# Patient Record
Sex: Female | Born: 2000 | Race: White | Hispanic: No | Marital: Single | Smoking: Never smoker
Health system: Southern US, Community
[De-identification: ages and names within clinical notes are randomized; demographics above are authoritative.]

## PROBLEM LIST (undated history)

## (undated) DIAGNOSIS — F419 Anxiety disorder, unspecified: Secondary | ICD-10-CM

## (undated) HISTORY — PX: MOUTH SURGERY: SHX715

## (undated) HISTORY — DX: Anxiety disorder, unspecified: F41.9

---
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
## 2019-04-17 ENCOUNTER — Ambulatory Visit: Payer: Self-pay | Admitting: *Deleted

## 2019-04-17 LAB — OB RESULTS CONSOLE HEPATITIS B SURFACE ANTIGEN: Hepatitis B Surface Ag: NEGATIVE

## 2019-04-17 LAB — OB RESULTS CONSOLE GC/CHLAMYDIA
Chlamydia: NEGATIVE
Gonorrhea: NEGATIVE

## 2019-04-17 LAB — OB RESULTS CONSOLE ANTIBODY SCREEN: Antibody Screen: NEGATIVE

## 2019-04-17 LAB — OB RESULTS CONSOLE RUBELLA ANTIBODY, IGM: Rubella: IMMUNE

## 2019-04-17 LAB — OB RESULTS CONSOLE RPR: RPR: NONREACTIVE

## 2019-04-17 LAB — OB RESULTS CONSOLE HIV ANTIBODY (ROUTINE TESTING): HIV: NONREACTIVE

## 2019-04-17 LAB — OB RESULTS CONSOLE ABO/RH: RH Type: POSITIVE

## 2019-04-17 NOTE — Telephone Encounter (Addendum)
Pt says that she is 7 weeks 2 days pregnant; last pm she had  bright red vaginal bleeding (more than usual) at MN and 0200; she says that she had intercourse 1 hour before this started; the pt says that she does 100 squats per night; it has happen previously after intercourse, and after a pelvic exam; the pt says that she spotting bright red or reddish pink; the pt says that she had previously spoken with her OB-GYN about; she has an appt with them on 04/19/2019; pt advised to contact her OB-GYN for further recommendations; she verbalized understanding.  Reason for Disposition . MILD vaginal bleeding (i.e., less than 1 pad / hour; less than patient's usual menstrual bleeding; not just spotting)  Answer Assessment - Initial Assessment Questions 1. ONSET: "When did this bleeding start?"        04/17/2019 at MN and 0200 2. DESCRIPTION: "Describe the bleeding that you are having." "How much bleeding is there?"    - SPOTTING: spotting, or pinkish / brownish mucous discharge; does not fill panti-liner or pad    - MILD:  less than 1 pad / hour; less than patient's usual menstrual bleeding   - MODERATE: 1-2 pads / hour; small-medium blood clots (e.g., pea, grape, small coin)    - SEVERE: soaking 2 or more pads/hour for 2 or more hours; bleeding not contained by pads or continuous red blood from vagina; large blood clots (e.g., golf ball, large coin)     Spotting bright red 3. ABDOMINAL PAIN SEVERITY: If present, ask: "How bad is it?"  (e.g., Scale 1-10; mild, moderate, or severe)   - MILD (1-3): doesn't interfere with normal activities, abdomen soft and not tender to touch    - MODERATE (4-7): interferes with normal activities or awakens from sleep, tender to touch    - SEVERE (8-10): excruciating pain, doubled over, unable to do any normal activities     Cramping 1-1.5 out of 10; has been having them consistently throughout pregrancy 4. PREGNANCY: "Do you know how many weeks or months pregnant you are?"      7 weeks and 2 days 5. EDD: "What date are you expecting to deliver?"  May 8,2021 6. FETAL MOVEMENT: "Has the baby's movement decreased or changed significantly from normal?"     Not felt any movement yet 7. HEMODYNAMIC STATUS: "Are you weak or feeling lightheaded?" If so, ask: "Can you stand and walk normally?"     Light headed if standing up too quickly;normal for her 8. OTHER SYMPTOMS: "What other symptoms are you having with the bleeding?" (e.g., leaking fluid from vagina, contractions)   Not sure  Protocols used: PREGNANCY - VAGINAL BLEEDING LESS THAN [redacted] WEEKS EGA-A-AH, PREGNANCY - VAGINAL BLEEDING GREATER THAN [redacted] WEEKS EGA-A-AH

## 2019-04-17 NOTE — Telephone Encounter (Signed)
Noted. FYI 

## 2019-06-18 ENCOUNTER — Inpatient Hospital Stay (HOSPITAL_COMMUNITY)
Admission: AD | Admit: 2019-06-18 | Discharge: 2019-06-18 | Disposition: A | Discharge status: Home / Self Care | Payer: 59 | Attending: Obstetrics and Gynecology | Admitting: Obstetrics and Gynecology

## 2019-06-18 ENCOUNTER — Other Ambulatory Visit: Payer: Self-pay

## 2019-06-18 ENCOUNTER — Encounter (HOSPITAL_COMMUNITY): Payer: Self-pay

## 2019-06-18 DIAGNOSIS — Z3A16 16 weeks gestation of pregnancy: Secondary | ICD-10-CM | POA: Diagnosis not present

## 2019-06-18 DIAGNOSIS — Z041 Encounter for examination and observation following transport accident: Secondary | ICD-10-CM | POA: Insufficient documentation

## 2019-06-18 DIAGNOSIS — Z880 Allergy status to penicillin: Secondary | ICD-10-CM | POA: Diagnosis not present

## 2019-06-18 DIAGNOSIS — O9A212 Injury, poisoning and certain other consequences of external causes complicating pregnancy, second trimester: Secondary | ICD-10-CM | POA: Diagnosis not present

## 2019-06-18 LAB — URINALYSIS, ROUTINE W REFLEX MICROSCOPIC
Bilirubin Urine: NEGATIVE
Glucose, UA: NEGATIVE mg/dL
Hgb urine dipstick: NEGATIVE
Ketones, ur: NEGATIVE mg/dL
Nitrite: NEGATIVE
Protein, ur: NEGATIVE mg/dL
Specific Gravity, Urine: 1.011 (ref 1.005–1.030)
pH: 6 (ref 5.0–8.0)

## 2019-06-18 LAB — POCT PREGNANCY, URINE: Preg Test, Ur: POSITIVE — AB
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
## 2019-06-18 NOTE — Discharge Instructions (Signed)
Preventing Injuries During Pregnancy  Injuries can happen during pregnancy. Minor falls and accidents usually do not harm you or your baby. But some injuries can harm you and your baby. Tell your doctor about any injury you suffer. What can I do to avoid injuries? Safety  Remove rugs and loose objects on the floor.  Wear comfortable shoes that have a good grip. Do not wear shoes that have high heels.  Always wear your seat belt in the car. The lap belt should be below your belly. Always drive safely.  Do not ride on a motorcycle. Activity  Do not take part in rough and violent activities or sports.  Avoid: ? Walking on wet or slippery floors. ? Lifting heavy pots of boiling or hot liquids. ? Fixing electrical problems. ? Being near fires. General instructions  Take over-the-counter and prescription medicines only as told by your doctor.  Know your blood type and the blood type of the baby's father.  If you are a victim of domestic violence: ? Call your local emergency services (911 in the [place] ? Contact the [employer] Violence Hotline for help and support. Get help right away if:  You fall on your belly or receive any serious blow to your belly.  You have a stiff neck or neck pain after a fall or an injury.  You get a headache or have problems with vision after an injury.  You do not feel the baby move or the baby is not moving as much as normal.  You have been a victim of domestic violence or any other kind of attack.  You have been in a car accident.  You have bleeding from your vagina.  Fluid is leaking from your vagina.  You start to have cramping or pain in your belly (contractions).  You have very bad pain in your lower back.  You feel weak or pass out (faint).  You start to throw up (vomit) after an injury.  You have been burned. Summary  Some injuries that happen during pregnancy can do harm to the baby.  Tell your doctor about any  injury.  Take steps to avoid injury. This includes removing rugs and loose objects on the floor. Always wear your seat belt in the car.  Do not take part in rough and violent activities or sports.  Get help right away if you have any serious accident or injury. This information is not intended to replace advice given to you by your health care provider. Make sure you discuss any questions you have with your health care provider. Document Released: 08/14/2010 Document Revised: 07/21/2016 Document Reviewed: 07/21/2016 Elsevier Patient Education  2020 [place] for [employer] at [facility]       Phone: [phone]  [facility] for [employer] at [place]   Phone: [place] for [employer] at [place]  Phone: [place] for [place] at [facility]  Phone: [place] for [place] at [place]  Phone: [place] for [place] at [facility]   Phone: [facility]       Phone: [phone]  [facility]    Phone: [facility]    Phone: [phone]  [facility]    Phone: [phone]  [place]    Phone: [phone]  [facility] Department-Family Planning  Phone: [phone]   [place] Department-Maternity  Phone: [place]    Phone: [phone]  [facility]   Phone: [phone]  [facility]      Phone: [phone]  [facility]    Phone: [phone]

## 2019-07-27 NOTE — L&D Delivery Note (Signed)
Delivery Note   Patient Name: Kathryn Melendez DOB: 03-29-2001 MRN: [id number]  Date of admission: 12/09/2019 Delivering MD: Noralyn Pick  Date of delivery: 12/09/19 Type of delivery: SVD  Newborn Data: Live born female  Birth Weight:   APGAR: 33, 22  Newborn Delivery   Birth date/time: 12/09/2019 18:59:00 Delivery type: Vaginal, Spontaneous     [name], 19 y.o., @ [redacted]w[redacted]d,  G1P0, who was admitted for IOL for postdates. I was called to the room when she progressed 1+ station in the second stage of labor. Deep variables noted and Dr Charlesetta Garibaldi notified of variables and possible need for vacuome assist, Dr Charlesetta Garibaldi en route to the hospital.   She pushed for 1hour.  She delivered a viable infant, cephalic and restituted to the ROA position over an intact perineum.  A nuchal cord   was identified loose and reduced, with right compound hand. The baby was placed on maternal abdomen while initial step of NRP were perfmored (Dry, Stimulated, and warmed). Hat placed on baby for thermoregulation. Delayed cord clamping was performed for 2 minutes.  Cord double clamped and cut.  Cord cut by father. Apgar scores were 8 and 9. Prophylactic Pitocin was started in the third stage of labor for active management. The placenta delivered spontaneously, shultz, with a 3 vessel cord and was sent to LD.  Inspection revealed right labial. An examination of the vaginal vault and cervix was free from lacerations. The uterus was firm, bleeding stable.  The repair was done under epidural.   Placenta and umbilical artery blood gas were not sent.  There were no complications during the procedure.  Mom and baby skin to skin following delivery. Left in stable condition.  Maternal Info: Anesthesia: Epidural Episiotomy: no Lacerations:  right labial repaired, left labial abrasion not repaired.  Suture Repair: 4.0 vicryl Est. Blood Loss (mL):  61mls  Newborn Info:  Baby Sex: female Circumcision: out pt circ desired Babies Name:  Cira Servant  APGAR (1 MIN): 8   APGAR (5 MINS): 9   APGAR (10 MINS):     Mom to postpartum.  Baby to Couplet care / Skin to Skin.  Dr Charlesetta Garibaldi updated on birth   [place], [place], NP-C 12/09/19 7:46 PM

## 2019-08-15 ENCOUNTER — Other Ambulatory Visit (HOSPITAL_COMMUNITY): Payer: Self-pay | Admitting: Obstetrics and Gynecology

## 2019-08-15 DIAGNOSIS — Z363 Encounter for antenatal screening for malformations: Secondary | ICD-10-CM

## 2019-08-15 DIAGNOSIS — Z3A26 26 weeks gestation of pregnancy: Secondary | ICD-10-CM

## 2019-08-27 ENCOUNTER — Other Ambulatory Visit: Payer: Self-pay

## 2019-08-27 ENCOUNTER — Ambulatory Visit (HOSPITAL_COMMUNITY)
Admission: RE | Admit: 2019-08-27 | Discharge: 2019-08-27 | Disposition: A | Discharge status: Home / Self Care | Payer: 59 | Source: Ambulatory Visit | Attending: Obstetrics and Gynecology | Admitting: Obstetrics and Gynecology

## 2019-08-27 ENCOUNTER — Other Ambulatory Visit (HOSPITAL_COMMUNITY): Payer: Self-pay | Admitting: *Deleted

## 2019-08-27 DIAGNOSIS — Z362 Encounter for other antenatal screening follow-up: Secondary | ICD-10-CM

## 2019-08-27 DIAGNOSIS — Z3A26 26 weeks gestation of pregnancy: Secondary | ICD-10-CM | POA: Insufficient documentation

## 2019-08-27 DIAGNOSIS — Z363 Encounter for antenatal screening for malformations: Secondary | ICD-10-CM | POA: Insufficient documentation

## 2019-09-24 ENCOUNTER — Ambulatory Visit (HOSPITAL_COMMUNITY)
Admission: RE | Admit: 2019-09-24 | Discharge: 2019-09-24 | Disposition: A | Discharge status: Home / Self Care | Payer: 59 | Source: Ambulatory Visit | Attending: Obstetrics and Gynecology | Admitting: Obstetrics and Gynecology

## 2019-09-24 ENCOUNTER — Other Ambulatory Visit (HOSPITAL_COMMUNITY): Payer: Self-pay | Admitting: *Deleted

## 2019-09-24 ENCOUNTER — Other Ambulatory Visit: Payer: Self-pay

## 2019-09-24 ENCOUNTER — Ambulatory Visit (HOSPITAL_COMMUNITY): Payer: 59

## 2019-09-24 DIAGNOSIS — Z3A30 30 weeks gestation of pregnancy: Secondary | ICD-10-CM

## 2019-09-24 DIAGNOSIS — Z362 Encounter for other antenatal screening follow-up: Secondary | ICD-10-CM

## 2019-10-23 ENCOUNTER — Other Ambulatory Visit: Payer: Self-pay

## 2019-10-23 ENCOUNTER — Ambulatory Visit (HOSPITAL_COMMUNITY)
Admission: RE | Admit: 2019-10-23 | Discharge: 2019-10-23 | Disposition: A | Discharge status: Home / Self Care | Payer: 59 | Source: Ambulatory Visit | Attending: Obstetrics and Gynecology | Admitting: Obstetrics and Gynecology

## 2019-10-23 DIAGNOSIS — Z362 Encounter for other antenatal screening follow-up: Secondary | ICD-10-CM | POA: Diagnosis not present

## 2019-10-23 DIAGNOSIS — Z3A34 34 weeks gestation of pregnancy: Secondary | ICD-10-CM

## 2019-11-06 LAB — OB RESULTS CONSOLE GBS: GBS: NEGATIVE

## 2019-11-28 ENCOUNTER — Telehealth (HOSPITAL_COMMUNITY): Payer: Self-pay | Admitting: *Deleted

## 2019-11-28 ENCOUNTER — Encounter (HOSPITAL_COMMUNITY): Payer: Self-pay | Admitting: *Deleted

## 2019-11-28 NOTE — Telephone Encounter (Signed)
Preadmission screen  

## 2019-12-05 ENCOUNTER — Other Ambulatory Visit: Payer: Self-pay | Admitting: Obstetrics & Gynecology

## 2019-12-07 ENCOUNTER — Other Ambulatory Visit (HOSPITAL_COMMUNITY)
Admission: RE | Admit: 2019-12-07 | Discharge: 2019-12-07 | Disposition: A | Discharge status: Home / Self Care | Payer: 59 | Source: Ambulatory Visit | Attending: Obstetrics & Gynecology | Admitting: Obstetrics & Gynecology

## 2019-12-07 LAB — SARS CORONAVIRUS 2 (TAT 6-24 HRS): SARS Coronavirus 2: NEGATIVE

## 2019-12-08 NOTE — H&P (Signed)
Kathryn Melendez is a 19 y.o. female, G1P0000, IUP at 41 weeks, presenting for IOL for Posdates. Late entry prenatal care @ 23 weeks, teenage pregnancy, allergy to penicillin, h/o anxiety no meds, low risk female, out pt circ, EFW via [place] on 5/03 was 6.15lbs. GBS-.  Pt endorse + Fm. Denies vaginal leakage. Denies vaginal bleeding. Endorses occ feeling cxt's.   Patient Active Problem List   Diagnosis Date Noted  . Post term pregnancy, 41 weeks 12/09/2019  . Greater trochanteric bursitis of right hip 06/01/2013  . Poison ivy 06/01/2013  . FOOT PAIN 02/28/2009  . GERD 08/12/2008  . 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    Past Medical History:  Diagnosis Date  . Anxiety      No current facility-administered medications on file prior to encounter.   No current outpatient medications on file prior to encounter.     Allergies  Allergen Reactions  . Penicillins     rash    History of present pregnancy: Pt Info/Preference:  Screening/Consents:  Labs:   EDD: Estimated Date of Delivery: 12/02/19  Establised: Patient's last menstrual period was 02/12/2019.  Anatomy Scan: Date: 10/23/2019 Placenta Location: anterior Genetic Screen: Panoroma:low risk female  AFP: WNL First Tri: Quad: Neg Horizon   Office: ccob            First PNV: 23 weeks Blood Type --/--/O POS, O POS Performed at [facility], [place] [address] [place], [place] [address]  [phone])  Language: english Last PNV: 40.2 wg Rhogam    Flu Vaccine:  utd   Antibody NEG (05/16 0027)  TDaP vaccine utd   GTT: Early: N/A Third Trimester: 72  Feeding Plan: Breast/bottle BTL: no Rubella: Immune (09/22 0000)  Contraception: ??? VBAC: no RPR: Nonreactive (09/22 0000)   Circumcision: Out pt desired   HBsAg: Negative (09/22 0000)  Pediatrician:  ???   HIV: Non-reactive (09/22 0000)   Prenatal Classes: no Additional [place]: Growth see below GBS: Negative/-- (04/13 0000)(For PCN allergy, check sensitivities)        Chlamydia: neg    MFM Referral/Consult:  GC: neg  Support Person: parnter/BF   PAP: ???  Pain Management: epidural Neonatologist Referral:  Hgb Electrophoresis:  AA  Birth Plan: none   Hgb NOB: 13    28W: 12.1  Growth 11/26/2019  OB History    Gravida  1   Para      Term      Preterm      AB      Living        SAB      TAB      Ectopic      Multiple      Live Births             Past Medical History:  Diagnosis Date  . Anxiety    Past Surgical History:  Procedure Laterality Date  . MOUTH SURGERY     Family History: family history includes Cancer in her maternal grandmother and paternal grandmother. Social History:  reports that she has never smoked. She has never used smokeless tobacco. She reports that she does not drink alcohol or use drugs.   Prenatal Transfer Tool  Maternal Diabetes: No Genetic Screening: Normal Maternal Ultrasounds/Referrals: Normal Fetal Ultrasounds or other Referrals:  None Maternal Substance Abuse:  No Significant Maternal Medications:  None Significant Maternal Lab Results: Group B Strep negative  ROS:  Review of Systems  Constitutional: Negative.   HENT: Negative.   Eyes: Negative.   Respiratory: Negative.   Cardiovascular: Negative.   Gastrointestinal: Negative.   Genitourinary: Negative.   Musculoskeletal: Negative.   Skin: Negative.   Neurological: Negative.   Endo/Heme/Allergies: Negative.   Psychiatric/Behavioral: Negative.      Physical Exam: BP 119/78   Pulse 76   Temp 98.2 F (36.8 C) (Oral)   Resp 18   Ht 5\' 6"  (1.676 m)   Wt 83.5 kg   LMP 02/12/2019   BMI 29.70 kg/m   Physical Exam  Constitutional: She is oriented to person, place, and time and well-developed, well-nourished, and in no distress.  HENT:  Head: Normocephalic and atraumatic.  Eyes: Pupils are equal, round, and reactive to light. Conjunctivae are normal.  Cardiovascular: Normal rate and regular rhythm.  Pulmonary/Chest:  Effort normal and breath sounds normal.  Abdominal: Soft. Bowel sounds are normal.  Genitourinary:    Genitourinary Comments: Uterus gravida equal to dates, soft non-tender, pelvis adequate, proven to 8 llbs.    Musculoskeletal:        General: Normal range of motion.     Cervical back: Normal range of motion and neck supple.  Neurological: She is alert and oriented to person, place, and time. Gait normal.  Skin: Skin is warm and dry.  Psychiatric: Affect normal.  Nursing note and vitals reviewed.    NST: FHR baseline 150 bpm, Variability: moderate, Accelerations:present, Decelerations:  Absent= Cat 1/Reactive UC:   Occ SVE: Dilation: Closed Effacement (%): 20 Station: -3 Exam by:: J Daton Szilagyi, CNM, vertex verified by fetal sutures.  Leopold's: Position vertex, EFW 7lbs via leopold's.   Labs: Results for orders placed or performed during the hospital encounter of 12/09/19 (from the past 24 hour(s))  CBC     Status: Abnormal   Collection Time: 12/09/19 12:27 AM  Result Value Ref Range   WBC 12.8 (H) 4.0 - 10.5 K/uL   RBC 4.46 3.87 - 5.11 MIL/uL   Hemoglobin 13.4 12.0 - 15.0 g/dL   HCT 39.9 36.0 - 46.0 %   MCV 89.5 80.0 - 100.0 fL   MCH 30.0 26.0 - 34.0 pg   MCHC 33.6 30.0 - 36.0 g/dL   RDW 13.2 11.5 - 15.5 %   Platelets 194 150 - 400 K/uL   nRBC 0.0 0.0 - 0.2 %  Type and screen     Status: None   Collection Time: 12/09/19 12:27 AM  Result Value Ref Range   ABO/RH(D) O POS    Antibody Screen NEG    Sample Expiration      12/12/2019,2359 Performed at [facility], [place] [address], [place], [place] [address]   ABO/Rh     Status: None (Preliminary result)   Collection Time: 12/09/19 12:27 AM  Result Value Ref Range   ABO/RH(D)      O POS Performed at [place] [address], [place], [place] [address]     Imaging:  No results found.  MAU Course: Orders Placed This Encounter  Procedures  . CBC  . RPR  . Diet clear liquid Room service appropriate?  Yes; Fluid consistency: Thin  . Discontinue Pitocin if tachysystole with non-reassuring FHR is present  . Evaluate fetal heart rate to establish reassuring pattern prior to initiating Cytotec or Pitocin  . If tachysystole WITH reassuring FHR present notify MD / CNM  . Initiate intrauterine resuscitation if tachysystole with non-reasuring FHR is present  . Labor Induction  .  May administer Terbutaline 0.25 mg SQ x 1 dose if tachysystole with non-reassuring FHR is presesnt  . Nofify MD/CNM if tachysystole with non-reassuring FHR is present  . Perform a cervical exam prior to initiating Cytotec or Pitocin  . Activity as tolerated  . Fetal monitoring per unit policy  . Notify Physician  . Vitals signs per unit policy  . Order Rapid HIV per protocol if no results on chart  . Cervical Exam  . Discontinue foley prior to vaginal delivery  . Fundal check post delivery every 15 min x 1 hour then every 30 min x 1 hour  . If Rapid HIV test positive or known HIV positive: initiate AZT orders  . Initiate Carrier Fluid Protocol  . Initiate Oral Care Protocol  . Insert foley catheter  . May in and out cath x 2 for inability to void  . Measure blood pressure post delivery every 15 min x 1 hour then every 30 min x 1 hour  . Patient may have epidural placement upon request  . Full code  . Type and screen  . ABO/Rh  . Insert and maintain IV Line  . Admit to Inpatient (patient's expected length of stay will be greater than 2 midnights or inpatient only procedure)   Meds ordered this encounter  Medications  . misoprostol (CYTOTEC) tablet 25 mcg  . oxytocin (PITOCIN) IV infusion 40 units in NS 1000 mL - Premix    Order Specific Question:   Begin infusion at:    Answer:   1 milli-unit/min (1.5 mL/hr)    Order Specific Question:   Increase infusion by:    Answer:   1 milli-unit/min (1.5 mL/hr)  . terbutaline (BRETHINE) injection 0.25 mg  . lactated ringers infusion  . lactated ringers infusion  500-1,000 mL  . oxytocin (PITOCIN) IV BOLUS FROM BAG  . oxytocin (PITOCIN) IV infusion 40 units in NS 1000 mL - Premix  . acetaminophen (TYLENOL) tablet 650 mg  . fentaNYL (SUBLIMAZE) injection 50-100 mcg  . lidocaine (PF) (XYLOCAINE) 1 % injection 30 mL  . ondansetron (ZOFRAN) injection 4 mg  . sodium citrate-citric acid (ORACIT) solution 30 mL  . sodium phosphate (FLEET) 7-19 GM/118ML enema 1 enema    Assessment/Plan: Corielle Fossum is a 19 y.o. female, G1P0000, IUP at 41 weeks, presenting for IOL for Posdates. Late entry prenatal care @ 23 weeks, teenage pregnancy, allergy to penicillin, h/o anxiety no meds, low risk female, out pt circ, EFW via [place] on 5/03 was 6.15lbs. GBS-. Pt endorse + Fm. Denies vaginal leakage. Denies vaginal bleeding. Endorses occ feeling cxt's.   FWB: Cat 1 Fetal Tracing.   Plan: Admit to [facility] per consult with DR Alwyn Pea Routine [facility] orders Pain med/epidural prn Cytotec for cervical ripening.  Anticipate labor progression   Noralyn Pick NP-C, CNM, MSN 12/09/2019, 4:23 AM

## 2019-12-09 ENCOUNTER — Inpatient Hospital Stay (HOSPITAL_COMMUNITY): Payer: 59 | Admitting: Anesthesiology

## 2019-12-09 ENCOUNTER — Inpatient Hospital Stay (HOSPITAL_COMMUNITY): Payer: 59

## 2019-12-09 ENCOUNTER — Encounter (HOSPITAL_COMMUNITY): Payer: Self-pay | Admitting: Obstetrics & Gynecology

## 2019-12-09 ENCOUNTER — Inpatient Hospital Stay (HOSPITAL_COMMUNITY)
Admission: AD | Admit: 2019-12-09 | Discharge: 2019-12-10 | DRG: 807 | Disposition: A | Discharge status: Home / Self Care | Payer: 59 | Attending: Obstetrics and Gynecology | Admitting: Obstetrics and Gynecology

## 2019-12-09 ENCOUNTER — Other Ambulatory Visit: Payer: Self-pay

## 2019-12-09 DIAGNOSIS — Z880 Allergy status to penicillin: Secondary | ICD-10-CM | POA: Diagnosis not present

## 2019-12-09 DIAGNOSIS — Z20822 Contact with and (suspected) exposure to covid-19: Secondary | ICD-10-CM | POA: Diagnosis present

## 2019-12-09 DIAGNOSIS — Z3A41 41 weeks gestation of pregnancy: Secondary | ICD-10-CM

## 2019-12-09 DIAGNOSIS — O480 Post-term pregnancy: Principal | ICD-10-CM | POA: Diagnosis present

## 2019-12-09 LAB — CBC
HCT: 39.9 % (ref 36.0–46.0)
Hemoglobin: 13.4 g/dL (ref 12.0–15.0)
MCH: 30 pg (ref 26.0–34.0)
MCHC: 33.6 g/dL (ref 30.0–36.0)
MCV: 89.5 fL (ref 80.0–100.0)
Platelets: 194 10*3/uL (ref 150–400)
RBC: 4.46 MIL/uL (ref 3.87–5.11)
RDW: 13.2 % (ref 11.5–15.5)
WBC: 12.8 10*3/uL — ABNORMAL HIGH (ref 4.0–10.5)
nRBC: 0 % (ref 0.0–0.2)

## 2019-12-09 LAB — RPR: RPR Ser Ql: NONREACTIVE

## 2019-12-09 LAB — ABO/RH: ABO/RH(D): O POS

## 2019-12-09 LAB — TYPE AND SCREEN
ABO/RH(D): O POS
Antibody Screen: NEGATIVE

## 2019-12-09 MED ORDER — ONDANSETRON HCL 4 MG PO TABS: 4.0000 mg | ORAL_TABLET | ORAL | Status: DC | PRN

## 2019-12-09 MED ORDER — TERBUTALINE SULFATE 1 MG/ML IJ SOLN: 0.2500 mg | Freq: Once | INTRAMUSCULAR | Status: DC | PRN

## 2019-12-09 MED ORDER — LACTATED RINGERS IV SOLN: INTRAVENOUS | Status: DC

## 2019-12-09 MED ORDER — FENTANYL CITRATE (PF) 100 MCG/2ML IJ SOLN
50.0000 ug | INTRAMUSCULAR | Status: DC | PRN
  Administered 2019-12-09: 50 ug via INTRAVENOUS
  Filled 2019-12-09: qty 2

## 2019-12-09 MED ORDER — BENZOCAINE-MENTHOL 20-0.5 % EX AERO: 1.0000 "application " | INHALATION_SPRAY | CUTANEOUS | Status: DC | PRN

## 2019-12-09 MED ORDER — MISOPROSTOL 25 MCG QUARTER TABLET
25.0000 ug | ORAL_TABLET | ORAL | Status: DC | PRN
  Administered 2019-12-09 (×2): 25 ug via VAGINAL
  Filled 2019-12-09 (×2): qty 1

## 2019-12-09 MED ORDER — PHENYLEPHRINE 40 MCG/ML (10ML) SYRINGE FOR IV PUSH (FOR BLOOD PRESSURE SUPPORT): 80.0000 ug | PREFILLED_SYRINGE | INTRAVENOUS | Status: DC | PRN

## 2019-12-09 MED ORDER — OXYTOCIN 40 UNITS IN NORMAL SALINE INFUSION - SIMPLE MED
1.0000 m[IU]/min | INTRAVENOUS | Status: DC
  Filled 2019-12-09: qty 1000

## 2019-12-09 MED ORDER — TETANUS-DIPHTH-ACELL PERTUSSIS 5-2.5-18.5 LF-MCG/0.5 IM SUSP: 0.5000 mL | Freq: Once | INTRAMUSCULAR | Status: DC

## 2019-12-09 MED ORDER — LIDOCAINE HCL (PF) 1 % IJ SOLN: 30.0000 mL | INTRAMUSCULAR | Status: DC | PRN

## 2019-12-09 MED ORDER — DIPHENHYDRAMINE HCL 50 MG/ML IJ SOLN: 12.5000 mg | INTRAMUSCULAR | Status: DC | PRN

## 2019-12-09 MED ORDER — WITCH HAZEL-GLYCERIN EX PADS: 1.0000 "application " | MEDICATED_PAD | CUTANEOUS | Status: DC | PRN

## 2019-12-09 MED ORDER — ZOLPIDEM TARTRATE 5 MG PO TABS: 5.0000 mg | ORAL_TABLET | Freq: Every evening | ORAL | Status: DC | PRN

## 2019-12-09 MED ORDER — SENNOSIDES-DOCUSATE SODIUM 8.6-50 MG PO TABS
2.0000 | ORAL_TABLET | ORAL | Status: DC
  Administered 2019-12-10: 2 via ORAL
  Filled 2019-12-09: qty 2

## 2019-12-09 MED ORDER — LIDOCAINE HCL (PF) 1 % IJ SOLN
INTRAMUSCULAR | Status: DC | PRN
  Administered 2019-12-09 (×2): 4 mL via EPIDURAL

## 2019-12-09 MED ORDER — LACTATED RINGERS IV SOLN: 500.0000 mL | INTRAVENOUS | Status: DC | PRN

## 2019-12-09 MED ORDER — FENTANYL-BUPIVACAINE-NACL 0.5-0.125-0.9 MG/250ML-% EP SOLN: 12.0000 mL/h | EPIDURAL | Status: DC | PRN

## 2019-12-09 MED ORDER — PRENATAL MULTIVITAMIN CH
1.0000 | ORAL_TABLET | Freq: Every day | ORAL | Status: DC
  Administered 2019-12-10: 1 via ORAL
  Filled 2019-12-09: qty 1

## 2019-12-09 MED ORDER — COCONUT OIL OIL: 1.0000 "application " | TOPICAL_OIL | Status: DC | PRN

## 2019-12-09 MED ORDER — ONDANSETRON HCL 4 MG/2ML IJ SOLN
4.0000 mg | Freq: Four times a day (QID) | INTRAMUSCULAR | Status: DC | PRN
  Administered 2019-12-09: 4 mg via INTRAVENOUS
  Filled 2019-12-09: qty 2

## 2019-12-09 MED ORDER — FENTANYL-BUPIVACAINE-NACL 0.5-0.125-0.9 MG/250ML-% EP SOLN
EPIDURAL | Status: AC
  Filled 2019-12-09: qty 250

## 2019-12-09 MED ORDER — DIBUCAINE (PERIANAL) 1 % EX OINT: 1.0000 "application " | TOPICAL_OINTMENT | CUTANEOUS | Status: DC | PRN

## 2019-12-09 MED ORDER — OXYTOCIN 40 UNITS IN NORMAL SALINE INFUSION - SIMPLE MED: 2.5000 [IU]/h | INTRAVENOUS | Status: DC

## 2019-12-09 MED ORDER — OXYTOCIN BOLUS FROM INFUSION
500.0000 mL | Freq: Once | INTRAVENOUS | Status: AC
  Administered 2019-12-09: 500 mL via INTRAVENOUS

## 2019-12-09 MED ORDER — DIPHENHYDRAMINE HCL 25 MG PO CAPS: 25.0000 mg | ORAL_CAPSULE | Freq: Four times a day (QID) | ORAL | Status: DC | PRN

## 2019-12-09 MED ORDER — LACTATED RINGERS IV SOLN
500.0000 mL | Freq: Once | INTRAVENOUS | Status: AC
  Administered 2019-12-09: 500 mL via INTRAVENOUS

## 2019-12-09 MED ORDER — ACETAMINOPHEN 325 MG PO TABS: 650.0000 mg | ORAL_TABLET | ORAL | Status: DC | PRN

## 2019-12-09 MED ORDER — SODIUM CHLORIDE (PF) 0.9 % IJ SOLN
INTRAMUSCULAR | Status: DC | PRN
  Administered 2019-12-09: 12 mL/h via EPIDURAL

## 2019-12-09 MED ORDER — IBUPROFEN 600 MG PO TABS
600.0000 mg | ORAL_TABLET | Freq: Four times a day (QID) | ORAL | Status: DC
  Administered 2019-12-10 (×4): 600 mg via ORAL
  Filled 2019-12-09 (×3): qty 1

## 2019-12-09 MED ORDER — ONDANSETRON HCL 4 MG/2ML IJ SOLN: 4.0000 mg | INTRAMUSCULAR | Status: DC | PRN

## 2019-12-09 MED ORDER — SOD CITRATE-CITRIC ACID 500-334 MG/5ML PO SOLN: 30.0000 mL | ORAL | Status: DC | PRN

## 2019-12-09 MED ORDER — FLEET ENEMA 7-19 GM/118ML RE ENEM: 1.0000 | ENEMA | RECTAL | Status: DC | PRN

## 2019-12-09 MED ORDER — SIMETHICONE 80 MG PO CHEW: 80.0000 mg | CHEWABLE_TABLET | ORAL | Status: DC | PRN

## 2019-12-09 NOTE — Progress Notes (Addendum)
Labor Progress Note  Kathryn Melendez is a 19 y.o. female, G1P0000, IUP at 41 weeks, presenting for IOL for Posdates. Late entry prenatal care @ 23 weeks, teenage pregnancy, allergy to penicillin, h/o anxiety no meds, low risk female, out pt circ, EFW via [place] on 5/03 was 6.15lbs. GBS-.    Subjective: Called RN for prolonged decled noted to nadir of 60s, prolonged for two mins, then for ofur with slow return to baselines, reviewed R/B/A for FSE and IUPC with pt, pt verbalized consent for procedure. Fetus and pt tolerated both procedures well, and with maternal position change strip now back to cat1 strip. Patient Active Problem List   Diagnosis Date Noted  . Post term pregnancy, 41 weeks 12/09/2019  . Greater trochanteric bursitis of right hip 06/01/2013  . Poison ivy 06/01/2013  . FOOT PAIN 02/28/2009  . GERD 08/12/2008  . 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 No intake/output data recorded. NST: FHR baseline 120 bpm, Variability: moderate, Accelerations:present, Decelerations:  repetative variables noted with cxts. = Cat 2/Reactive CTX:  irregular, every 1-5 minutes, lasting 50-60 seconds Uterus gravid, soft non tender, moderate to palpate with contractions.  SVE:  Dilation: 6.5 Effacement (%): 80 Station: -1 Exam by:: Jade, CNM  FSE and IUPC placed with ease.   MVU 200  Assessment:  Kathryn Melendez is a 19 y.o. female, G1P0000, IUP at 41 weeks, presenting for IOL for Posdates. Late entry prenatal care @ 23 weeks, teenage pregnancy, allergy to penicillin, h/o anxiety no meds, low risk female, out pt circ, EFW via [place] on 5/03 was 6.15lbs. GBS-.  Pt progressing in active labor now off two cytotex and AROM. Patient Active Problem List   Diagnosis Date Noted  . Post term pregnancy, 41 weeks 12/09/2019  . Greater  trochanteric bursitis of right hip 06/01/2013  . Poison ivy 06/01/2013  . FOOT PAIN 02/28/2009  . GERD 08/12/2008  . 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   Membranes:  AROM, clear @ 1416 on 5/16, no s/s of infection  MVU 200  Induction:    Cytotec x2 @ 0336, 0736  Foley Bulb: NO  Pitocin - Not indicated.   Pain management:               IV pain management: x1 dose fentanyl @ 1225             Epidural placement: Placed on 5/16 @ 1240  GBS Negative  Plan: Continue labor plan Continuous monitoring Rest Frequent position changes to facilitate fetal rotation and descent with peanut ball. Will reassess with cervical exam at 1700 or earlier if necessary Continue with natural cxt Anticipate if variables continue to start amnioinfusion.  Anticipate labor progression and vaginal delivery.   Noralyn Pick, NP-C, CNM, MSN 12/09/2019. 4:12 PM

## 2019-12-09 NOTE — Progress Notes (Signed)
Labor Progress Note  Kathryn Melendez is a 19 y.o. female, G1P0000, IUP at 41 weeks, presenting for IOL for Posdates. Late entry prenatal care @ 23 weeks, teenage pregnancy, allergy to penicillin, h/o anxiety no meds, low risk female, out pt circ, EFW via [place] on 5/03 was 6.15lbs. GBS-.    Subjective: Pt resting in bed comfortable post epidural placement, pt desires AROM now, was Cat 1 strip, tolerated AROM well but 5 mins after started having repetative decels, revolved with position changes , fluid bolus. Patient Active Problem List   Diagnosis Date Noted  . Post term pregnancy, 41 weeks 12/09/2019  . Greater trochanteric bursitis of right hip 06/01/2013  . Poison ivy 06/01/2013  . FOOT PAIN 02/28/2009  . GERD 08/12/2008  . 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 No intake/output data recorded. NST: FHR baseline 120 bpm, Variability: moderate, Accelerations:present, Decelerations:  repetative variables noted with cxts. = Cat 2/Reactive CTX:  irregular, every 1-5 minutes, lasting 50-60 seconds Uterus gravid, soft non tender, moderate to palpate with contractions.  SVE:  Dilation: 4 Effacement (%): 80 Station: -1 Exam by:: Luvenia Starch, CNM  Assessment:  Kathryn Melendez is a 19 y.o. female, G1P0000, IUP at 41 weeks, presenting for IOL for Posdates. Late entry prenatal care @ 23 weeks, teenage pregnancy, allergy to penicillin, h/o anxiety no meds, low risk female, out pt circ, EFW via [place] on 5/03 was 6.15lbs. GBS-.  Pt progressing in latent labor off two cytotex and ow AROM. Marland Kitchen  Patient Active Problem List   Diagnosis Date Noted  . Post term pregnancy, 41 weeks 12/09/2019  . Greater trochanteric bursitis of right hip 06/01/2013  . Poison ivy 06/01/2013  . FOOT PAIN 02/28/2009  . GERD 08/12/2008  . 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   Pain management:               IV pain management: x1 dose fentanyl @ 1225             Epidural placement: Placed on 5/16 @ 1240  GBS Negative  Plan: Continue labor plan Continuous monitoring Rest Frequent position changes to facilitate fetal rotation and descent with peanut ball. Will reassess with cervical exam at 1700 or earlier if necessary Continue with natural cxt Anticipate if variables continue to start amnioinfusion.  Anticipate labor progression and vaginal delivery.   Noralyn Pick, NP-C, CNM, MSN 12/09/2019. 4:03 PM

## 2019-12-09 NOTE — Anesthesia Preprocedure Evaluation (Signed)
Anesthesia Evaluation  Patient identified by MRN, date of birth, ID band Patient awake    Reviewed: Allergy & Precautions, Patient's Chart, lab work & pertinent test results  Airway Mallampati: II  TM Distance: >3 FB Neck ROM: Full    Dental no notable dental hx. (+) Teeth Intact   Pulmonary neg pulmonary ROS,    Pulmonary exam normal breath sounds clear to auscultation       Cardiovascular negative cardio ROS Normal cardiovascular exam Rhythm:Regular Rate:Normal     Neuro/Psych Anxiety negative neurological ROS     GI/Hepatic Neg liver ROS, GERD  Medicated and Controlled,  Endo/Other  negative endocrine ROS  Renal/GU negative Renal ROS  negative genitourinary   Musculoskeletal negative musculoskeletal ROS (+)   Abdominal   Peds  Hematology negative hematology ROS (+)   Anesthesia Other Findings   Reproductive/Obstetrics (+) Pregnancy                             Anesthesia Physical Anesthesia Plan  ASA: II  Anesthesia Plan: Epidural   Post-op Pain Management:    Induction:   PONV Risk Score and Plan:   Airway Management Planned: Natural Airway  Additional Equipment:   Intra-op Plan:   Post-operative Plan:   Informed Consent: I have reviewed the patients History and Physical, chart, labs and discussed the procedure including the risks, benefits and alternatives for the proposed anesthesia with the patient or authorized representative who has indicated his/her understanding and acceptance.       Plan Discussed with: Anesthesiologist  Anesthesia Plan Comments:         Anesthesia Quick Evaluation

## 2019-12-09 NOTE — Progress Notes (Signed)
Labor Progress Note  Kathryn Melendez is a 19 y.o. female, G1P0000, IUP at 41 weeks, presenting for IOL for Posdates. Late entry prenatal care @ 23 weeks, teenage pregnancy, allergy to penicillin, h/o anxiety no meds, low risk female, out pt circ, EFW via [place] on 5/03 was 6.15lbs. GBS-.    Subjective: Pt resting in bed with partner on phone next to her, pt endorses crying with some cxt now, pt desires epidural, discussed AROM versus pitocin, pt endorses wanting AROM, and verbally consent, but pt stared crying with nerves and I recommended she get the epidural first then once she is comfortable I would come back in and rupture her.  Patient Active Problem List   Diagnosis Date Noted  . Post term pregnancy, 41 weeks 12/09/2019  . Greater trochanteric bursitis of right hip 06/01/2013  . Poison ivy 06/01/2013  . FOOT PAIN 02/28/2009  . GERD 08/12/2008  . 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 No intake/output data recorded. NST: FHR baseline 125 bpm, Variability: moderate, Accelerations:present, Decelerations:  Absent= Cat 1/Reactive CTX:  irregular, every 1-5 minutes, lasting 50-60 seconds Uterus gravid, soft non tender, mild to palpate with contractions.  SVE:  Dilation: 2.5 Effacement (%): 80 Station: -2 Exam by:: Luvenia Starch, CNM   Assessment:  Kathryn Melendez is a 19 y.o. female, G1P0000, IUP at 41 weeks, presenting for IOL for Posdates. Late entry prenatal care @ 23 weeks, teenage pregnancy, allergy to penicillin, h/o anxiety no meds, low risk female, out pt circ, EFW via [place] on 5/03 was 6.15lbs. GBS-.  Pt progressing in early labor off two cytotex.  Patient Active Problem List   Diagnosis Date Noted  . Post term pregnancy, 41 weeks 12/09/2019  . Greater trochanteric bursitis of right hip 06/01/2013  . Poison ivy 06/01/2013  .  FOOT PAIN 02/28/2009  . GERD 08/12/2008  . 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   Pain management:               IV pain management: x1 dose fentanyl @ 1225             Epidural placement: PRN  GBS Negative  Plan: Continue labor plan Continuous monitoring Rest Ambulate Frequent position changes to facilitate fetal rotation and descent. Will reassess with cervical exam at 1400 or earlier if necessary Plan for epidural now then AROM Anticipate labor progression and vaginal delivery.   Noralyn Pick, NP-C, CNM, MSN 12/09/2019. 3:57 PM

## 2019-12-09 NOTE — Plan of Care (Signed)
  Problem: Education: Goal: Ability to state and carry out methods to decrease the pain will improve Outcome: Completed/Met Goal: Individualized Educational Video(s) Outcome: Completed/Met   Problem: Coping: Goal: Ability to verbalize concerns and feelings about labor and delivery will improve Outcome: Completed/Met   Problem: Life Cycle: Goal: Ability to make normal progression through stages of labor will improve Outcome: Completed/Met Goal: Ability to effectively push during vaginal delivery will improve Outcome: Completed/Met   Problem: Role Relationship: Goal: Will demonstrate positive interactions with the child Outcome: Completed/Met   Problem: Safety: Goal: Risk of complications during labor and delivery will decrease Outcome: Completed/Met   Problem: Pain Management: Goal: Relief or control of pain from uterine contractions will improve Outcome: Completed/Met

## 2019-12-09 NOTE — Progress Notes (Signed)
Labor Progress Note  Kathryn Melendez is a 19 y.o. female, G1P0000, IUP at 41 weeks, presenting for IOL for Posdates. Late entry prenatal care @ 23 weeks, teenage pregnancy, allergy to penicillin, h/o anxiety no meds, low risk female, out pt circ, EFW via [place] on 5/03 was 6.15lbs. GBS-.    Subjective: Pt resting in bed with partner at bedside, pt stable feeling cxt but tolerates WELL.  Patient Active Problem List   Diagnosis Date Noted  . Post term pregnancy, 41 weeks 12/09/2019  . Greater trochanteric bursitis of right hip 06/01/2013  . Poison ivy 06/01/2013  . FOOT PAIN 02/28/2009  . GERD 08/12/2008  . 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 No intake/output data recorded. NST: FHR baseline 125 bpm, Variability: moderate, Accelerations:present, Decelerations:  Absent= Cat 1/Reactive CTX:  irregular, every 1-5 minutes, lasting 50-60 seconds Uterus gravid, soft non tender, mild to palpate with contractions.  SVE:  Dilation: 1 Effacement (%): 20 Station: -3 Exam by:: Acey Lav   Assessment:  Kathryn Melendez is a 19 y.o. female, G1P0000, IUP at 41 weeks, presenting for IOL for Posdates. Late entry prenatal care @ 23 weeks, teenage pregnancy, allergy to penicillin, h/o anxiety no meds, low risk female, out pt circ, EFW via [place] on 5/03 was 6.15lbs. GBS-.  Pt progressing in early labor with Cytotec.  Patient Active Problem List   Diagnosis Date Noted  . Post term pregnancy, 41 weeks 12/09/2019  . Greater trochanteric bursitis of right hip 06/01/2013  . Poison ivy 06/01/2013  . FOOT PAIN 02/28/2009  . GERD 08/12/2008  . 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   Pain management:              IV pain management: PRN             Epidural placement: PRN  GBS Negative  Plan: Continue labor plan Continuous monitoring Rest Ambulate Frequent position changes to facilitate fetal rotation and descent. Will reassess with cervical exam at 1200 or earlier if necessary Will reassess for foley or pitocin next check  Anticipate labor progression and vaginal delivery.   Md Dillard aware of plan and verbalized agreement.   Noralyn Pick, NP-C, CNM, MSN 12/09/2019. 3:51 PM

## 2019-12-09 NOTE — Anesthesia Procedure Notes (Signed)
Epidural Patient location during procedure: OB Start time: 12/09/2019 12:54 PM End time: 12/09/2019 1:02 PM  Staffing Anesthesiologist: Josephine Igo, MD Performed: anesthesiologist   Preanesthetic Checklist Completed: patient identified, IV checked, site marked, risks and benefits discussed, surgical consent, monitors and equipment checked, pre-op evaluation and timeout performed  Epidural Patient position: sitting Prep: DuraPrep and site prepped and draped Patient monitoring: continuous pulse ox and blood pressure Approach: midline Location: L3-L4 Injection technique: LOR air  Needle:  Needle type: Tuohy  Needle gauge: 17 G Needle length: 9 cm and 9 Needle insertion depth: 4 cm Catheter type: closed end flexible Catheter size: 19 Gauge Catheter at skin depth: 9 cm Test dose: negative and Other  Assessment Events: blood not aspirated, injection not painful, no injection resistance, no paresthesia and negative IV test  Additional Notes Patient identified. Risks and benefits discussed including failed block, incomplete  Pain control, post dural puncture headache, nerve damage, paralysis, blood pressure Changes, nausea, vomiting, reactions to medications-both toxic and allergic and post Partum back pain. All questions were answered. Patient expressed understanding and wished to proceed. Sterile technique was used throughout procedure. Epidural site was Dressed with sterile barrier dressing. No paresthesias, signs of intravascular injection Or signs of intrathecal spread were encountered.  Patient was more comfortable after the epidural was dosed. Please see RN's note for documentation of vital signs and FHR which are stable. Reason for block:procedure for pain

## 2019-12-10 LAB — CBC
HCT: 37 % (ref 36.0–46.0)
Hemoglobin: 12.2 g/dL (ref 12.0–15.0)
MCH: 30.4 pg (ref 26.0–34.0)
MCHC: 33 g/dL (ref 30.0–36.0)
MCV: 92.3 fL (ref 80.0–100.0)
Platelets: 162 10*3/uL (ref 150–400)
RBC: 4.01 MIL/uL (ref 3.87–5.11)
RDW: 13.4 % (ref 11.5–15.5)
WBC: 14.3 10*3/uL — ABNORMAL HIGH (ref 4.0–10.5)
nRBC: 0 % (ref 0.0–0.2)

## 2019-12-10 MED ORDER — ACETAMINOPHEN 325 MG PO TABS: 650.0000 mg | ORAL_TABLET | ORAL | Status: DC | PRN

## 2019-12-10 MED ORDER — IBUPROFEN 600 MG PO TABS: 600.0000 mg | ORAL_TABLET | Freq: Four times a day (QID) | ORAL | 0 refills | Status: DC

## 2019-12-10 NOTE — Anesthesia Postprocedure Evaluation (Signed)
Anesthesia Post Note  Patient: Kathryn Melendez  Procedure(s) Performed: AN [facility]     Patient location during evaluation: Mother Baby Anesthesia Type: Epidural Level of consciousness: awake Pain management: satisfactory to patient Vital Signs Assessment: post-procedure vital signs reviewed and stable Respiratory status: spontaneous breathing Cardiovascular status: stable Anesthetic complications: no    Last Vitals:  Vitals:   12/10/19 0232 12/10/19 0644  BP: 114/61 (!) 109/47  Pulse: 62 (!) 54  Resp: 18 18  Temp: 36.5 C 36.6 C  SpO2:  100%    Last Pain:  Vitals:   12/10/19 0644  TempSrc: Oral  PainSc:    Pain Goal:                   Casimer Lanius

## 2019-12-10 NOTE — Lactation Note (Signed)
This note was copied from a baby's chart. Lactation Consultation Note  Patient Name: Kathryn Melendez [id number] Date: 12/10/2019 Reason for consult: Initial assessment;Term;Primapara;1st time breastfeeding  P1 mother whose infant is now 87 hours old.  This is a term baby at 41+0 weeks.    RN requested a lactation consultation.  However, a lactation consult was not initiated on admission.  Asked RN to obtain an order.  RN placed the order and I informed her that I would see this family.  Mother had asked the RN for formula and RN asked me to visit with the family prior to initiating formula.  Baby was STS when I arrived and sleeping on mother's chest.  Mother was concerned that he has not awakened and will not latch at this time.  He has latched a few times since delivery and mother has also hand expressed and spoon fed a few mls of colostrum.  Educated mother on basics including STS, hand expression, tummy size, feeding cues and what to expect the first 24 hours after birth.  Mother did not realize that this is typical behavior for a baby at this age.  Reassured her that baby is doing well at this time and to continue to observe for feeding cues and ask for latch assistance as needed.  Baby has already had one void and three bowel movements; shared the importance of this with parents.  Encouraged to feed 8-12 times/24 hours or sooner if baby shows feeding cues.  Mother has been taught hand expression.  Colostrum container provided and milk storage times reviewed.  Finger feeding demonstrated.  Mother does not have a DEBP for home use.  She is a [facility] participant in [facility] but desires to obtain the formula package.  Informed her that she will not be eligible to obtain a DEBP if this is her choice.  Mother verbalized understanding.  Explained the gift shop pump rental options.  Mother satisfied that baby does not need any formula at this time.  She will call for assistance when baby is ready to  breast feed.  I also explained the option of donor milk if supplementation is needed.  RN updated.   Maternal Data Formula Feeding for Exclusion: No Has patient been taught Hand Expression?: Yes Does the patient have breastfeeding experience prior to this delivery?: No  Feeding Feeding Type: Breast Milk  LATCH Score                   Interventions    Lactation Tools Discussed/Used WIC Program: Yes   Consult Status Consult Status: Follow-up Date: 12/11/19 Follow-up type: In-patient    Valary Manahan R Dyon Rotert 12/10/2019, 1:18 PM

## 2019-12-10 NOTE — Progress Notes (Signed)
CSW received consult for hx of Anxiety. CSW met with MOB to offer support and complete assessment.    CSW congratulated MOB on the birth of infant. CSW advised MOB of CSW's role and the reason for CSW coming by to visit with her. MOB reported that she was not aware of her anxiety diagnosis until she changed OB providers. MOB reported that she was made aware of the diagnoses and about 20 weeks. MOB reported that she has been on medications in the past for her mood disorder and reported that these medications worked well for her. MOB currently repost no desire or need for medications as MOB reported "Ive been well". MOB reported that she has had therapy in the past but stopped it as she felt that she was doing better. MOB declined resources for therapy in the PP period. MOB reported that she isnt SI, HI and she isn't in a DV relationship at this time.   MOB verbalized that she has supports from FOB, her family as well as FOB's family. MOB reported that she has all needed items to care for infant including infant sleeping in a basinet once arrived home.   CSW provided education regarding the baby blues period vs. perinatal mood disorders, discussed treatment and gave resources for mental health follow up if concerns arise.  CSW recommends self-evaluation during the postpartum time period using the New Mom Checklist from Postpartum Progress and encouraged MOB to contact a medical professional if symptoms are noted at any time.   CSW provided review of Sudden Infant Death Syndrome (SIDS) precautions.   CSW identifies no further need for intervention and no barriers to discharge at this time.    Kathryn Melendez Kathryn Melendez, MSW, LCSW [facility] [place] [phone]

## 2019-12-10 NOTE — Progress Notes (Addendum)
Call by RN for pt requesting discharge after 24 hours. Peds has dc'd newborn. Pt aware of time but desires to go  Home if possible. Pt seen this AM and stable. Will re-eval at bedside, complete dc teaching, and follow-up in 6 wks.   Burman Foster, MSN, CNM 12/10/2019 10:00 PM

## 2019-12-10 NOTE — Progress Notes (Signed)
PPD# 1 SVD w/ bilat labial lacerations Information for the patient's newborn:  Rebeccah, Meixsell [id number]  female    Baby Name Lona Millard Circumcision plans out-patient w/ [facility]   S:   Reports feeling good Tolerating PO fluid and solids No nausea or vomiting Bleeding is moderate, no clots Pain controlled with PO meds Up ad lib / ambulatory / voiding w/o difficulty Feeding: Bottle and Breast    O:   VS: BP (!) 109/47 (BP Location: Left Arm)   Pulse (!) 54   Temp 97.9 F (36.6 C) (Oral)   Resp 18   Ht 5\' 6"  (1.676 m)   Wt 83.5 kg   LMP 02/12/2019   SpO2 100%   Breastfeeding Unknown   BMI 29.70 kg/m   LABS:  Recent Labs    12/09/19 0027 12/10/19 0452  WBC 12.8* 14.3*  HGB 13.4 12.2  PLT 194 162   Blood type: --/--/O POS, O POS Performed at [facility] [address] [place], [place] [address]  [phone]) Rubella: Immune (09/22 0000)                      I&O: Intake/Output      05/16 0701 - 05/17 0700 05/17 0701 - 05/18 0700   Urine (mL/kg/hr) 1000 (0.5)    Blood 58    Total Output 1058    Net -1058         Urine Occurrence 1 x      Physical Exam: Alert and oriented X3 Abdomen: soft, non-tender, non-distended  Fundus: firm, non-tender Perineum: non-edematous Lochia: appropriate Extremities: 1+, non-pitting,edema, no calf pain, cords, or tenderness    A:  PPD # 1 Normal exam Anxiety- takes no meds  P:  Routine post partum orders SW consult completed     -declines med and therapy, reports adequate support from FOB and family Anticipate D/C on 12/11/19   Plan reviewed w/ Dr. Effie Shy, MSN, CNM 12/10/2019, 9:51 AM

## 2019-12-10 NOTE — Discharge Summary (Signed)
Postpartum Discharge Summary     Patient Name: Kathryn Melendez DOB: Sep 15, 2000 MRN: [id number]  Date of admission: 12/09/2019 Delivery date:12/09/2019  Delivering provider: Noralyn Pick  Date of discharge: 12/10/2019  Admitting diagnosis: Post term pregnancy, 41 weeks [O48.0, Z3A.41] Intrauterine pregnancy: [redacted]w[redacted]d    Secondary diagnosis:  Active Problems:   Post term pregnancy, 41 weeks  Additional problems:  Patient Active Problem List   Diagnosis Date Noted  . SVD (5/16) 12/10/2019  . Postpartum care following vaginal delivery 12/10/2019  . Post term pregnancy, 41 weeks 12/09/2019  . Greater trochanteric bursitis of right hip 06/01/2013  . GERD 08/12/2008       Discharge diagnosis: Term Pregnancy Delivered                                              Post partum procedures:none Augmentation: Cytotec Complications: None  Hospital course: Induction of Labor With Vaginal Delivery   19y.o. yo G1P1001 at 439w0das admitted to the hospital 12/09/2019 for induction of labor.  Indication for induction: Postdates.  Patient had an uncomplicated labor course as follows: Membrane Rupture Time/Date: 2:16 PM ,12/09/2019   Delivery Method:Vaginal, Spontaneous  Episiotomy: None  Lacerations:  Labial  Details of delivery can be found in separate delivery note.  Patient had a routine postpartum course. Patient is discharged home 12/10/19.  Newborn Data: Birth date:12/09/2019  Birth time:6:59 PM  Gender:Female  Living status:Living  Apgars:8 ,9  Weight:3359 g   Magnesium Sulfate received: No BMZ received: No Rhophylac:N/A MMR:N/A Transfusion:No  Physical exam  Vitals:   12/09/19 2226 12/10/19 0232 12/10/19 0644 12/10/19 1404  BP: (!) 122/57 114/61 (!) 109/47 115/81  Pulse: (!) 56 62 (!) 54 61  Resp: '18 18 18 18  '$ Temp: (!) 97.5 F (36.4 C) 97.7 F (36.5 C) 97.9 F (36.6 C) 98.4 F (36.9 C)  TempSrc: Oral Oral Oral Oral  SpO2: 99%  100%   Weight:      Height:        General: alert, cooperative and no distress Lochia: appropriate Uterine Fundus: firm Incision: N/A DVT Evaluation: No evidence of DVT seen on physical exam. No cords or calf tenderness. No significant calf/ankle edema. Labs: Lab Results  Component Value Date   WBC 14.3 (H) 12/10/2019   HGB 12.2 12/10/2019   HCT 37.0 12/10/2019   MCV 92.3 12/10/2019   PLT 162 12/10/2019   No flowsheet data found. Edinburgh Score: No flowsheet data found.    After visit meds:  Allergies as of 12/10/2019      Reactions   Penicillins    rash      Medication List    TAKE these medications   acetaminophen 325 MG tablet Commonly known as: Tylenol Take 2 tablets (650 mg total) by mouth every 4 (four) hours as needed (for pain scale < 4).   ibuprofen 600 MG tablet Commonly known as: ADVIL Take 1 tablet (600 mg total) by mouth every 6 (six) hours. Start taking on: Dec 11, 2019        Discharge home in stable condition Infant Feeding: Bottle and Breast Infant Disposition:home with mother Discharge instruction: per After Visit Summary and Postpartum booklet. Activity: Advance as tolerated. Pelvic rest for 6 weeks.  Diet: routine diet Anticipated Birth Control: Condoms Postpartum Appointment:6 weeks Additional Postpartum F/U: none Future Appointments:No future appointments.  Follow up Visit: [facility]. Schedule an appointment as soon as possible for a visit in 6 week(s).   Specialty: Obstetrics and Gynecology Contact information: [address] [place] [address] [phone]              12/10/2019 Arrie Eastern, CNM

## 2020-05-13 IMAGING — US US MFM OB COMP +14 WKS
1 series · 13 of 28 positions shown · non-contrast
Comparison: none

[Series 1: us mfm ob comp +14 wks · 13 of 65 slices shown]
[im 3/65]
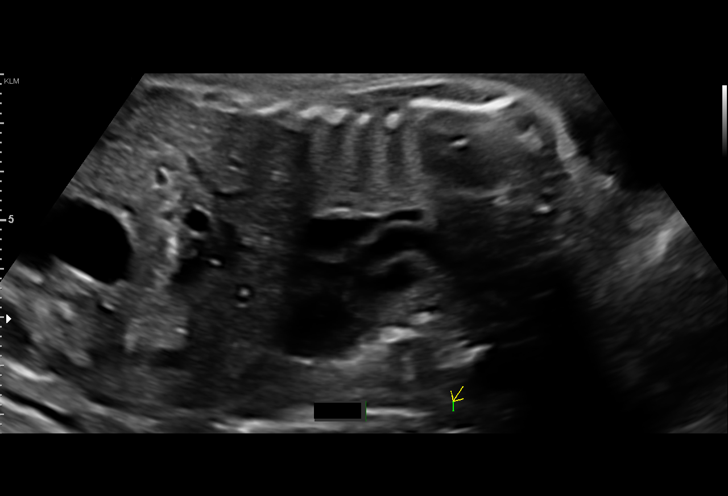
[im 8/65]
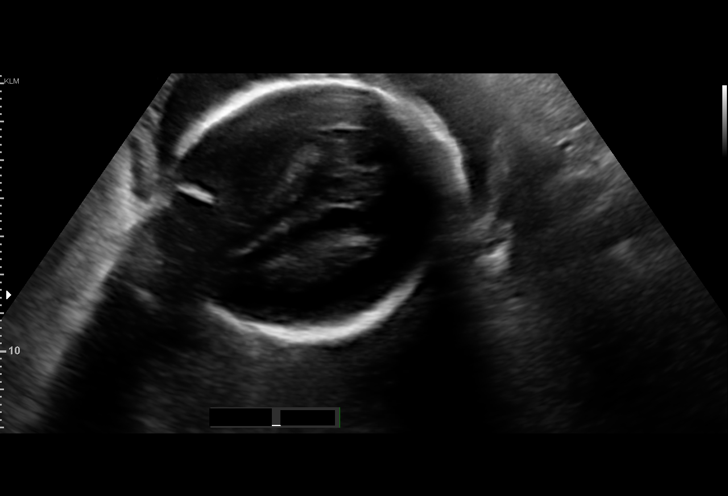
[im 12/65]
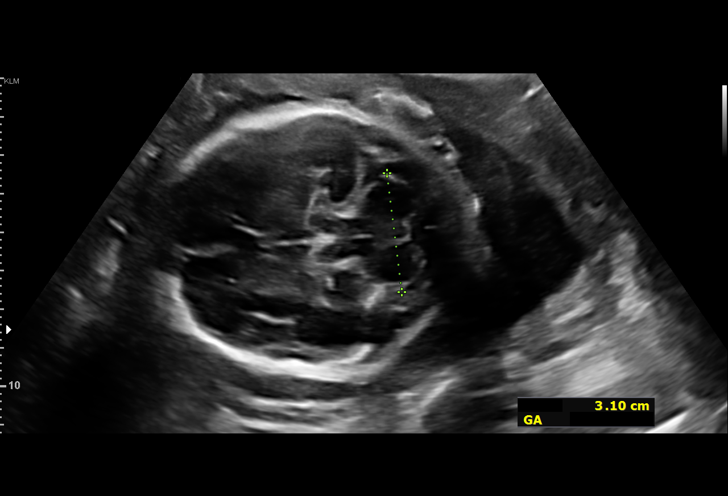
[im 17/65]
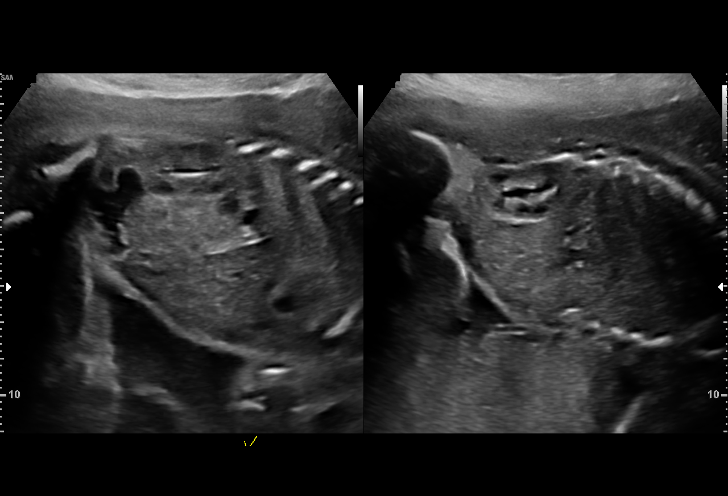
[im 22/65]
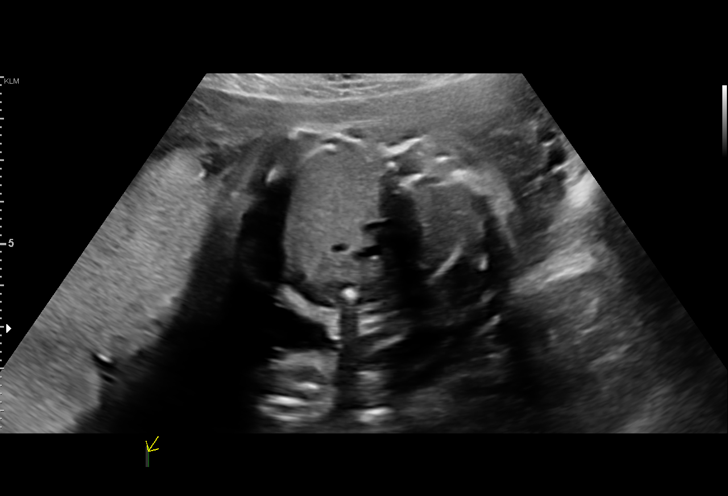
[im 27/65]
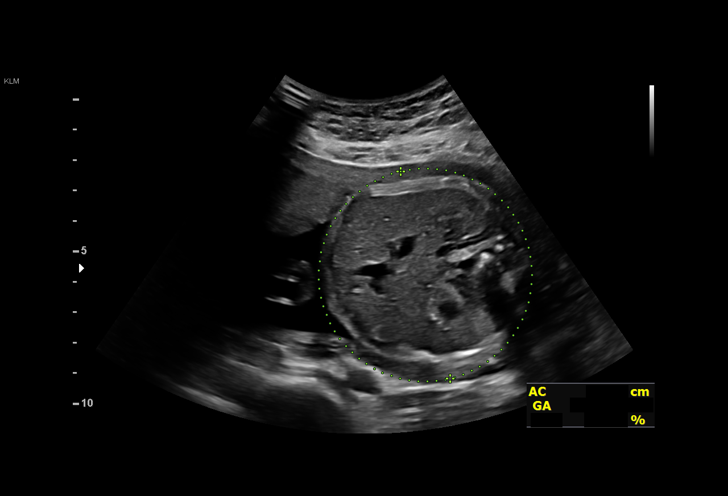
[im 34/65]
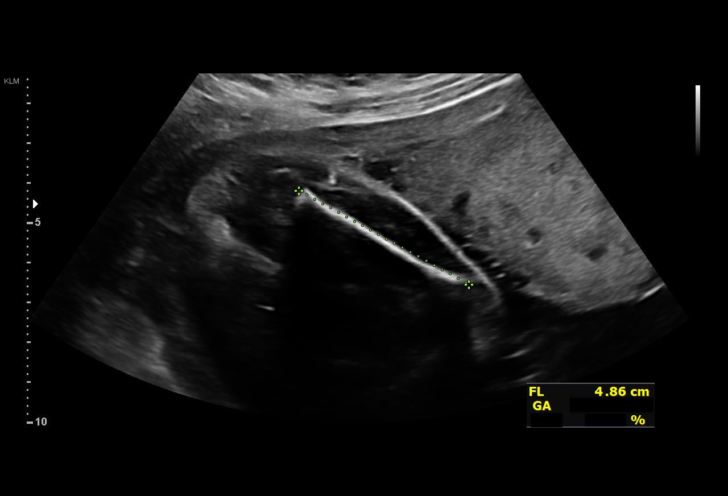
[im 38/65]
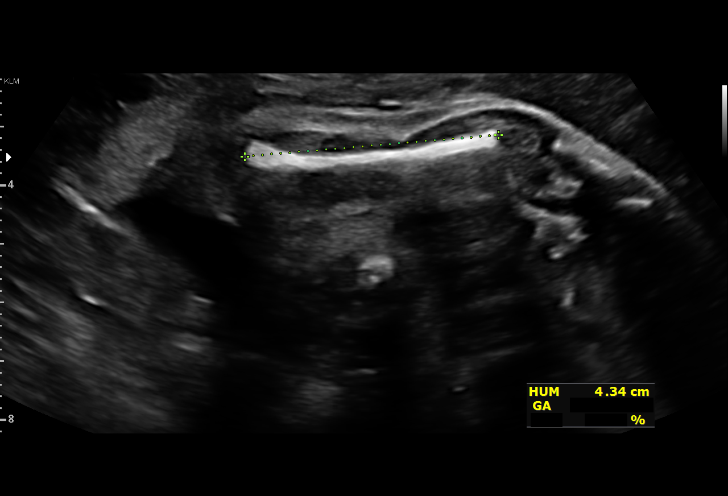
[im 43/65]
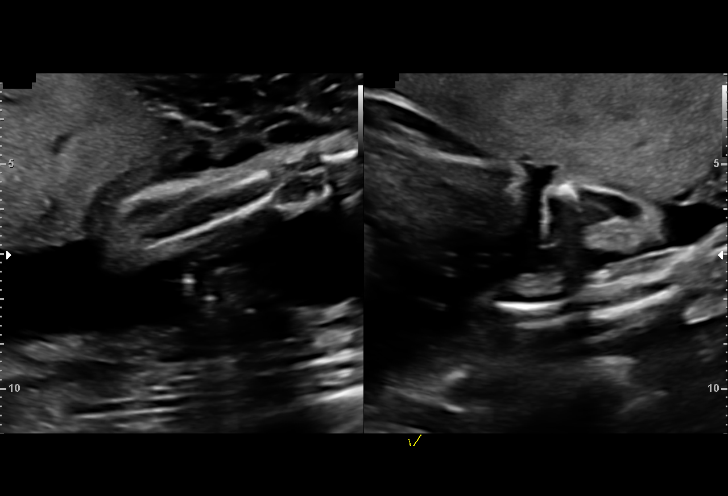
[im 48/65]
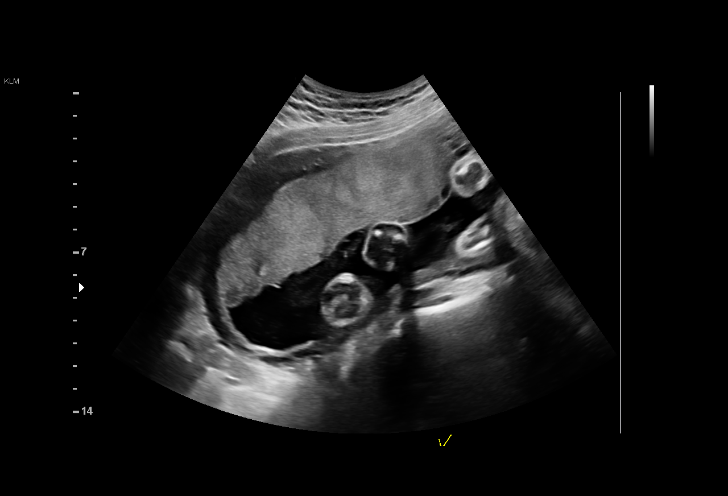
[im 53/65]
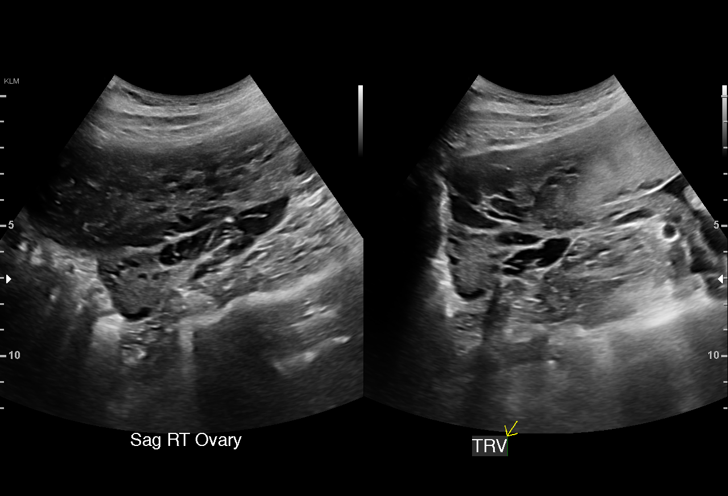
[im 57/65]
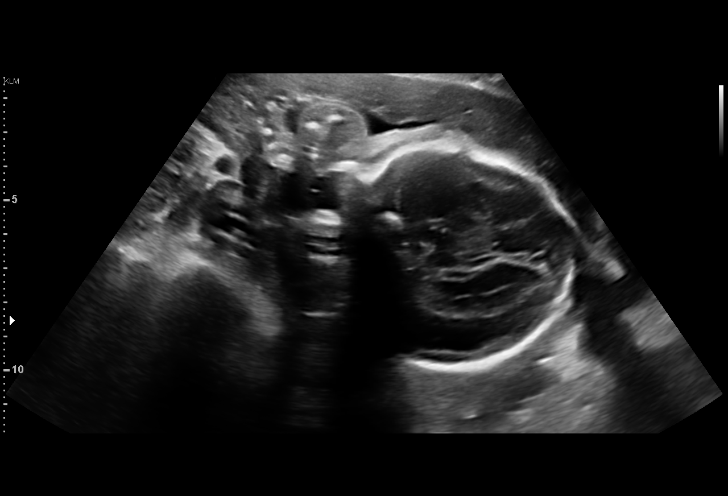
[im 62/65]
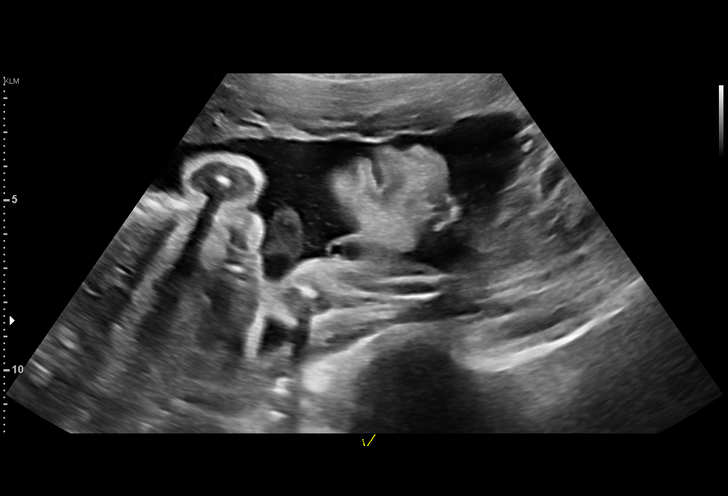

[13 of 28 positions shown; findings below may reference images not displayed]

Obstetrics &
                                                            Gynecology
                                                            [address]
                                                            [place].
                   CNM

  1  US MFM OB COMP + 14 WK               [address]805.01     DONDRE MAMI
 ----------------------------------------------------------------------

 ----------------------------------------------------------------------
Indications

  Encounter for antenatal screening for
  malformations
  26 weeks gestation of pregnancy
 ----------------------------------------------------------------------
Fetal Evaluation

 Num Of Fetuses:         1
 Fetal Heart Rate(bpm):  149
 Cardiac Activity:       Observed
 Presentation:           Cephalic
 Placenta:               Anterior
 P. Cord Insertion:      Visualized

 Amniotic Fluid
 AFI FV:      Within normal limits
Biometry

 BPD:      67.3  mm     G. Age:  27w 1d         73  %    CI:        71.81   %    70 - 86
                                                         FL/HC:      18.7   %    18.6 -
 HC:      252.8  mm     G. Age:  27w 3d         70  %    HC/AC:      1.15        1.04 -
 AC:      219.4  mm     G. Age:  26w 3d         49  %    FL/BPD:     70.3   %    71 - 87
 FL:       47.3  mm     G. Age:  25w 6d         25  %    FL/AC:      21.6   %    20 - 24
 HUM:      42.6  mm     G. Age:  25w 4d         31  %
 CER:        31  mm     G. Age:  27w 0d         70  %

 Est. FW:     921  gm           2 lb     46  %
OB History

 Gravidity:    1         Term:   0        Prem:   0        SAB:   0
 TOP:          0       Ectopic:  0        Living: 0
Gestational Age

 LMP:           28w 0d        Date:  02/12/19                 EDD:   11/19/19
 U/S Today:     26w 5d                                        EDD:   11/28/19
 Best:          26w 1d     Det. By:  Early Ultrasound         EDD:   12/02/19
                                     (06/18/19)
Anatomy

 Cranium:               Appears normal         Aortic Arch:            Appears normal
 Cavum:                 Appears normal         Ductal Arch:            Not well visualized
 Ventricles:            Appears normal         Diaphragm:              Appears normal
 Choroid Plexus:        Appears normal         Stomach:                Appears normal, left
                                                                       sided
 Cerebellum:            Appears normal         Abdomen:                Appears normal
 Posterior Fossa:       Appears normal         Abdominal Wall:         Not well visualized
 Nuchal Fold:           Not applicable (>20    Cord Vessels:           Appears normal (3
                        wks GA)                                        vessel cord)
 Face:                  Orbits nl; profile not Kidneys:                Appear normal
                        well visualized
 Lips:                  Appears normal         Bladder:                Appears normal
 Thoracic:              Appears normal         Spine:                  Appears normal
 Heart:                 Appears normal         Upper Extremities:      Appears normal
                        (4CH, axis, and
                        situs)
 RVOT:                  Appears normal         Lower Extremities:      Appears normal
 LVOT:                  Appears normal

 Other:  Heels visualized. 3VV and 3VTV visualized.
Cervix Uterus Adnexa

 Cervix
 Not visualized (advanced GA >97wks)

 Uterus
 No abnormality visualized.

 Left Ovary
 Within normal limits.

 Right Ovary
 Within normal limits.

 Cul De Sac
 No free fluid seen.

 Adnexa
 No abnormality visualized.
Impression

 G1 P0. Patient is here for fetal anatomy scan.
 On cell-free fetal DNA screening, the risks of fetal
 aneuploidies are not increased.
 We performed fetal anatomy scan. No makers of
 aneuploidies or fetal structural defects are seen. Fetal
 biometry is consistent with her previously-established dates.
 Amniotic fluid is normal and good fetal activity is seen.
 Patient understands the limitations of ultrasound in detecting
 fetal anomalies.
 We reassured the patient of the findings.
Recommendations

 -An appointment was made for her to return in 4 weeks for
 completion of fetal anatomy.
                 Cumaco, Jonathan Uriel

## 2020-06-10 IMAGING — US US MFM OB FOLLOW-UP
1 series · 13 of 28 positions shown · non-contrast
Comparison: none

[Series 1: us mfm ob follow-up · 13 of 35 slices shown]
[im 2/35]
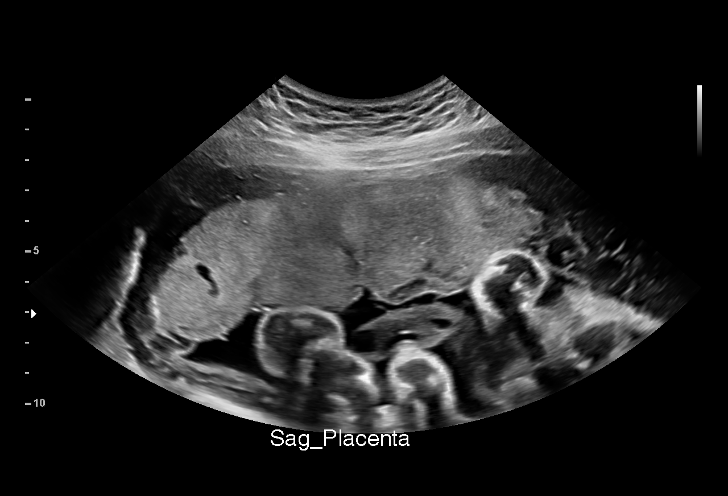
[im 4/35]
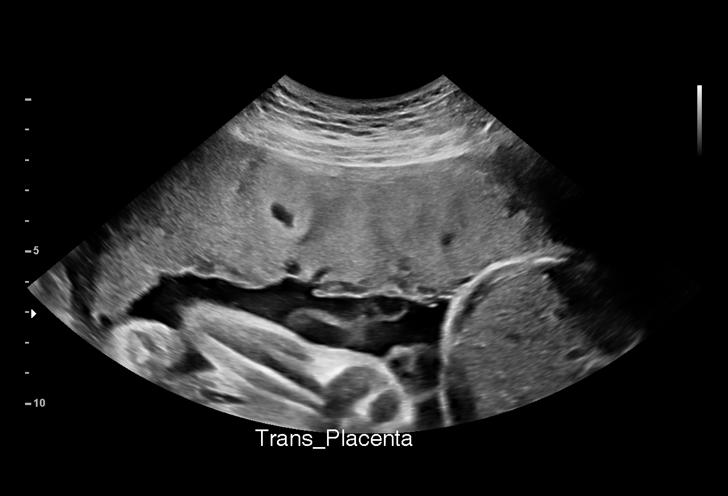
[im 7/35]
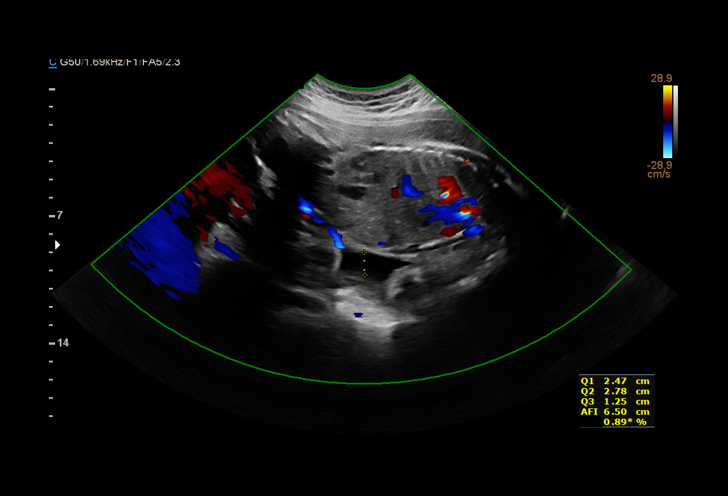
[im 9/35]
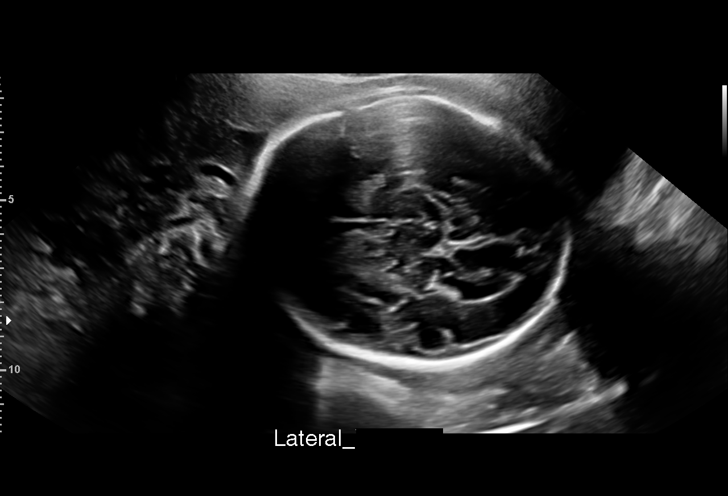
[im 12/35]
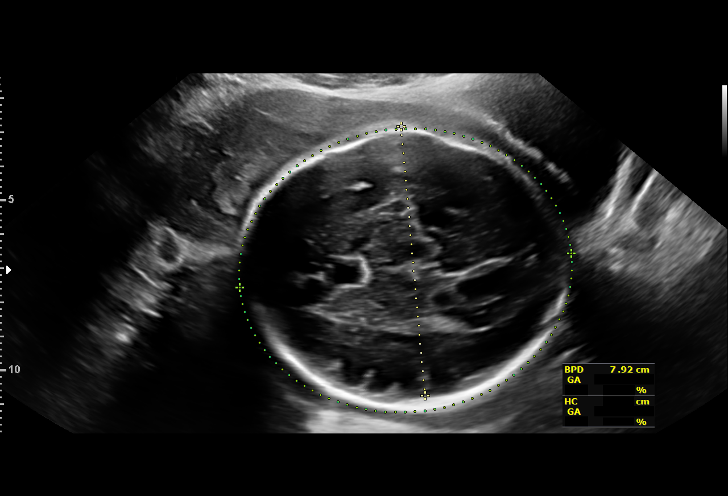
[im 14/35]
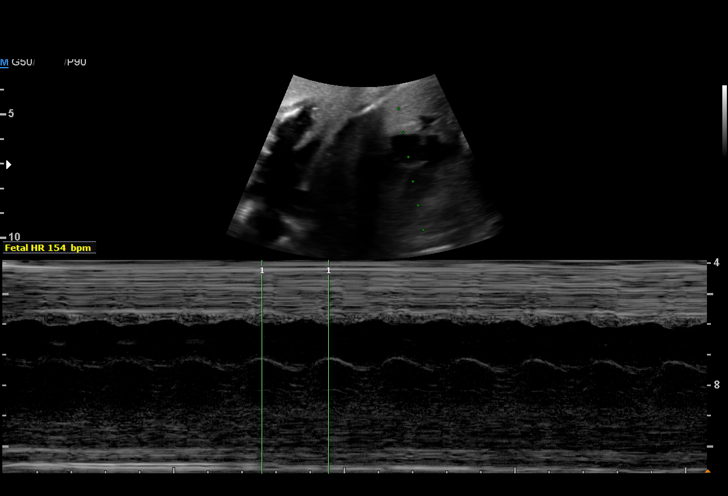
[im 18/35]
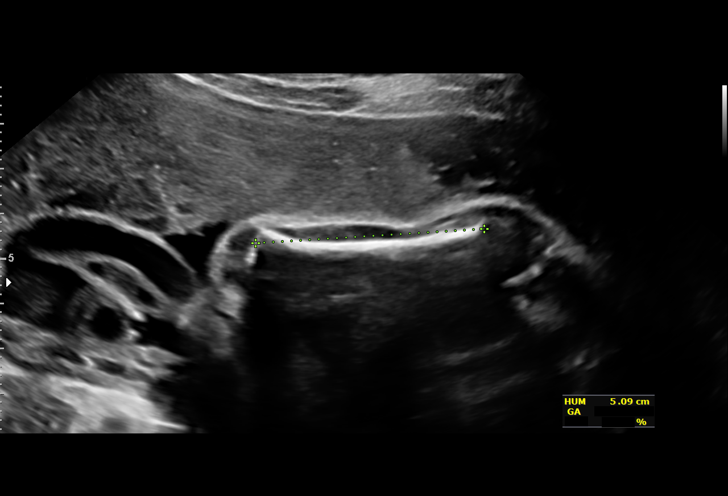
[im 21/35]
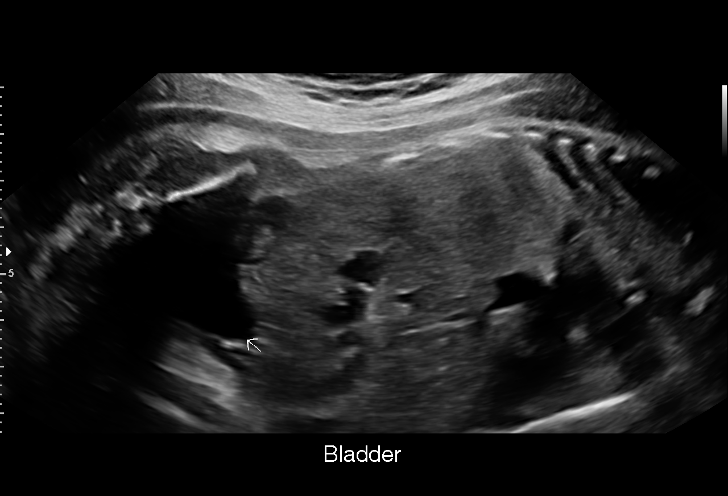
[im 23/35]
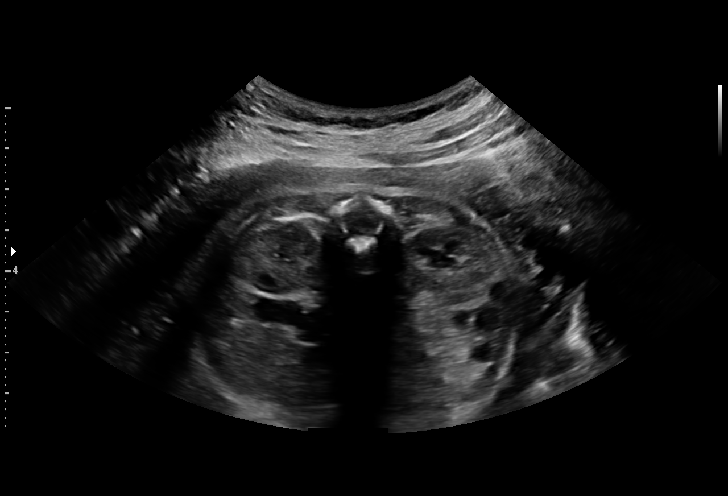
[im 26/35]
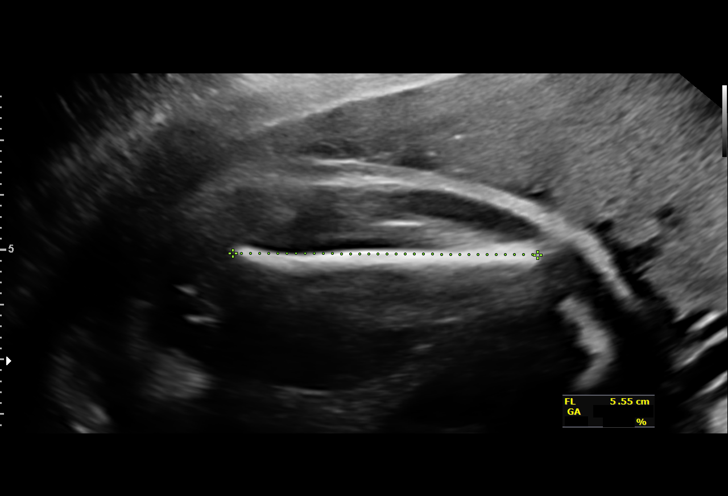
[im 28/35]
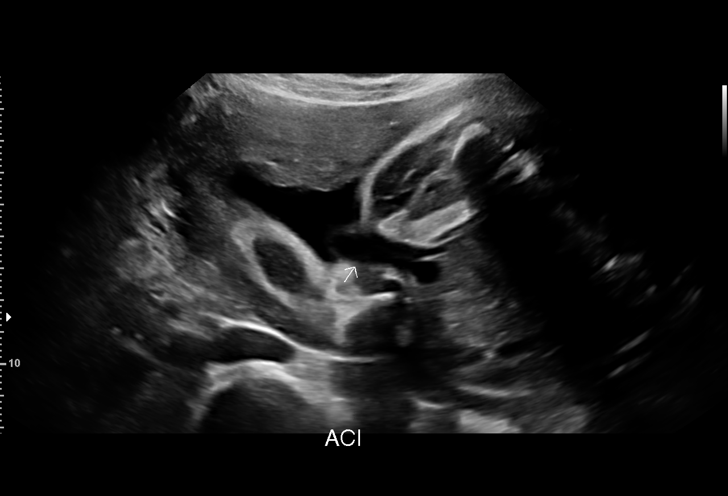
[im 31/35]
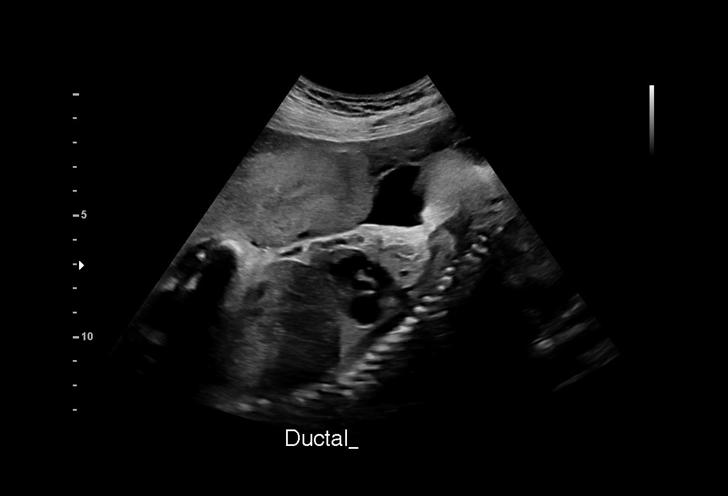
[im 33/35]
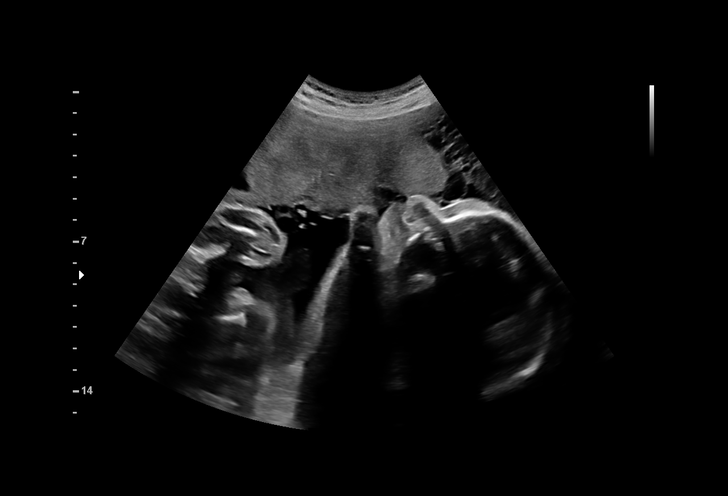

[13 of 28 positions shown; findings below may reference images not displayed]

Obstetrics &
                                                             Gynecology
                                                             [address]
                                                             [place].
                   CNM

 ----------------------------------------------------------------------

 ----------------------------------------------------------------------
Indications

  Encounter for antenatal screening for
  malformations
  30 weeks gestation of pregnancy
 ----------------------------------------------------------------------
Fetal Evaluation

 Num Of Fetuses:          1
 Fetal Heart Rate(bpm):   154
 Cardiac Activity:        Observed
 Presentation:            Cephalic
 Placenta:                Anterior
 P. Cord Insertion:       Visualized

 Amniotic Fluid
 AFI FV:      Within normal limits

 AFI Sum(cm)     %Tile       Largest Pocket(cm)
 9.51            9

 RUQ(cm)       RLQ(cm)       LUQ(cm)        LLQ(cm)

Biometry

 BPD:      79.1  mm     G. Age:  31w 5d         84  %    CI:        77.66   %    70 - 86
                                                         FL/HC:       19.6  %    19.2 -
 HC:      284.1  mm     G. Age:  31w 1d         44  %    HC/AC:       1.15       0.99 -
 AC:       248   mm     G. Age:  29w 0d         15  %    FL/BPD:      70.4  %    71 - 87
 FL:       55.7  mm     G. Age:  29w 2d         16  %    FL/AC:       22.5  %    20 - 24
 HUM:      49.3  mm     G. Age:  29w 0d         28  %

 LV:        3.8  mm

 Est. FW:    6322   gm     3 lb 2 oz     19  %
OB History

 Gravidity:    1         Term:   0        Prem:   0        SAB:   0
 TOP:          0       Ectopic:  0        Living: 0
Gestational Age

 LMP:           32w 0d        Date:  02/12/19                 EDD:   11/19/19
 U/S Today:     30w 2d                                        EDD:   12/01/19
 Best:          30w 1d     Det. By:  Early Ultrasound         EDD:   12/02/19
                                     (06/18/19)
Anatomy

 Cranium:               Appears normal         Aortic Arch:            Previously seen
 Cavum:                 Appears normal         Ductal Arch:            Appears normal
 Ventricles:            Appears normal         Diaphragm:              Appears normal
 Choroid Plexus:        Previously seen        Stomach:                Appears normal, left
                                                                       sided
 Cerebellum:            Previously seen        Abdomen:                Appears normal
 Posterior Fossa:       Previously seen        Abdominal Wall:         Appears nml (cord
                                                                       insert, abd wall)
 Nuchal Fold:           Not applicable (>20    Cord Vessels:           Previously seen
                        wks GA)
 Face:                  Orbits appear          Kidneys:                Appear normal
                        normal
 Lips:                  Previously seen        Bladder:                Appears normal
 Thoracic:              Appears normal         Spine:                  Previously seen
 Heart:                 Previously seen        Upper Extremities:      Previously seen
 RVOT:                  Previously seen        Lower Extremities:      Previously seen
 LVOT:                  Previously seen

 Other:  Technically difficult due to fetal position.
Impression

 Follow up anatomy
 Suboptimal views obtained secondary to fetal position
 Good fetal movement and amniotic fluid
Recommendations

 Follow up anatomy scheduled in 4 weeks.

## 2020-07-09 IMAGING — US US MFM OB FOLLOW-UP
1 series · 14 of 28 positions shown · non-contrast
Comparison: none

[Series 1: us mfm ob follow-up · 42 acquisitions, 14 frames shown]
[im 2/42]
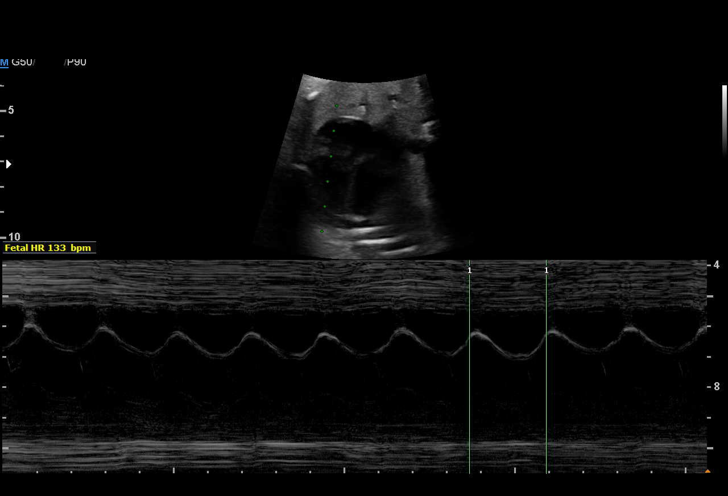
[im 5/42]
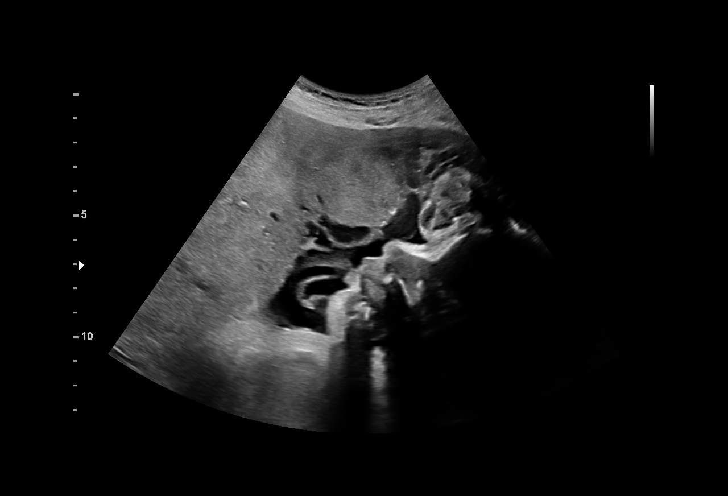
[im 8/42]
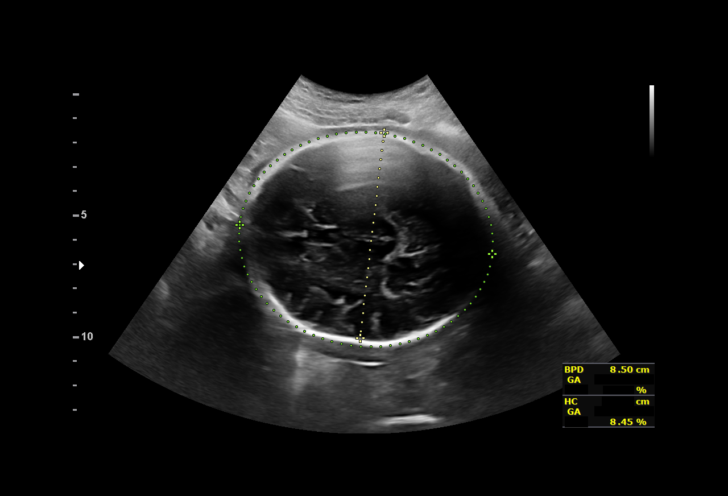
[im 11/42]
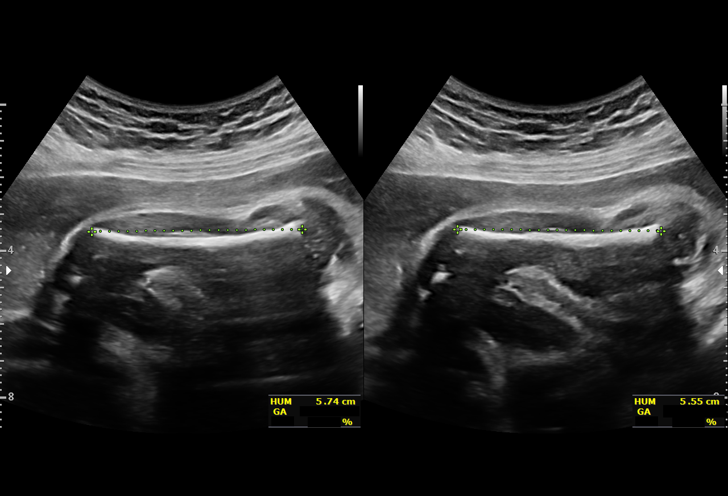
[im 14/42]
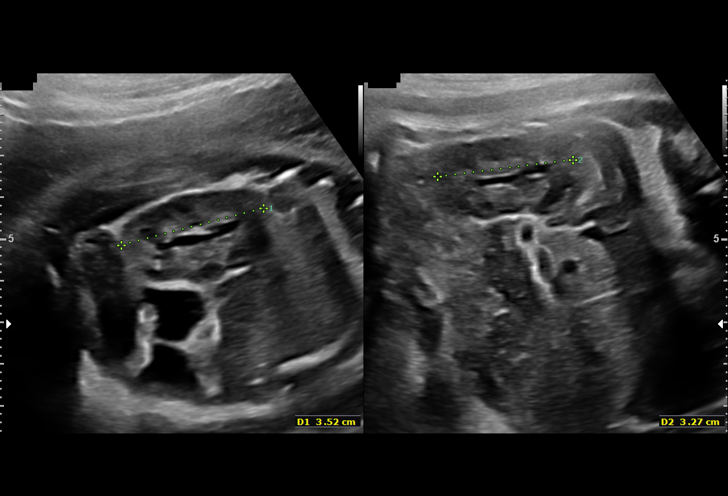
[im 17/42]
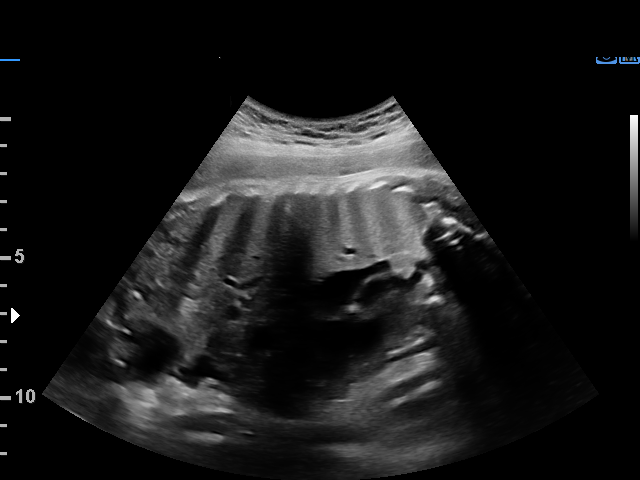
[im 20/42]
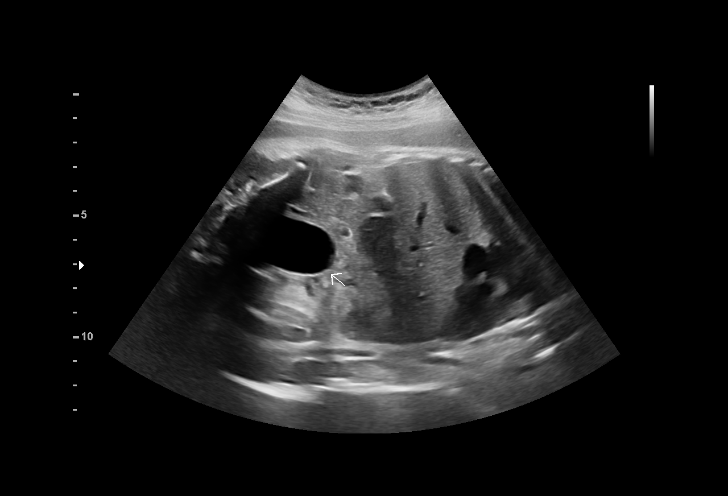
[im 23/42]
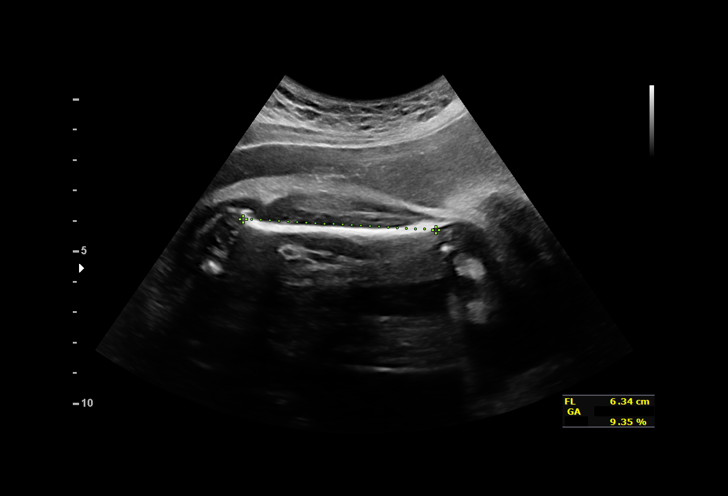
[im 26/42]
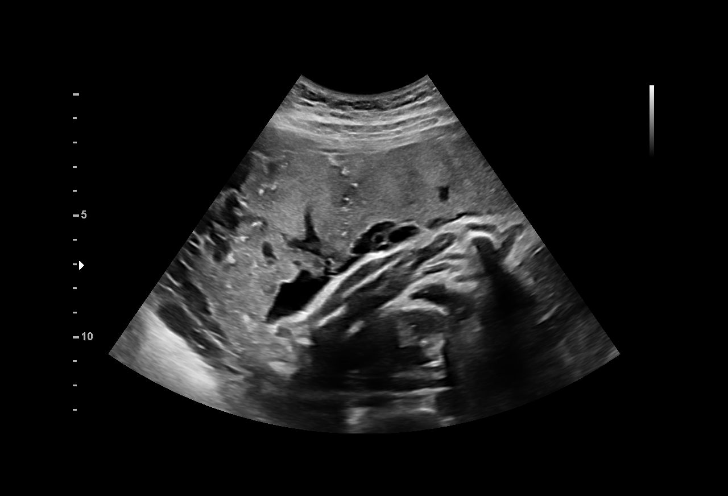
[im 29/42]
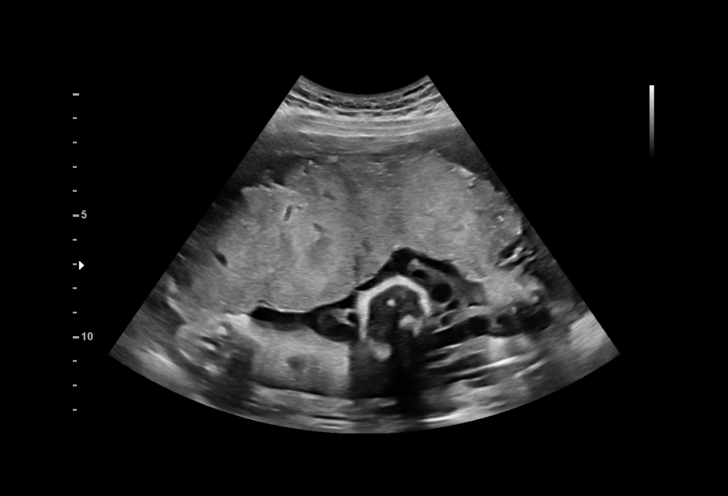
[im 32/42]
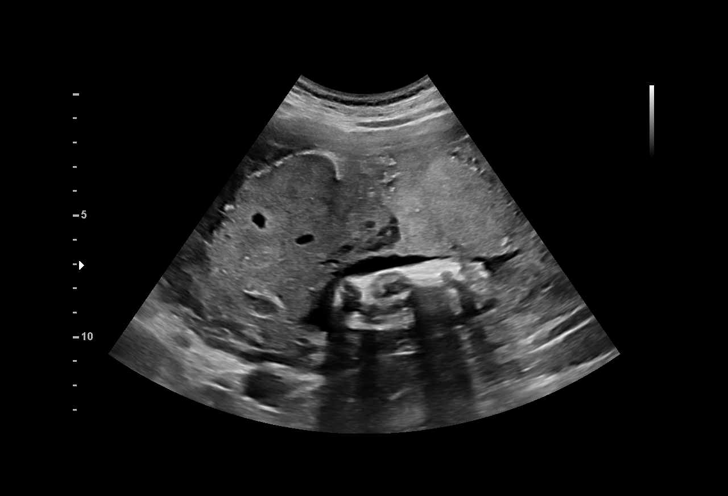
[im 35/42]
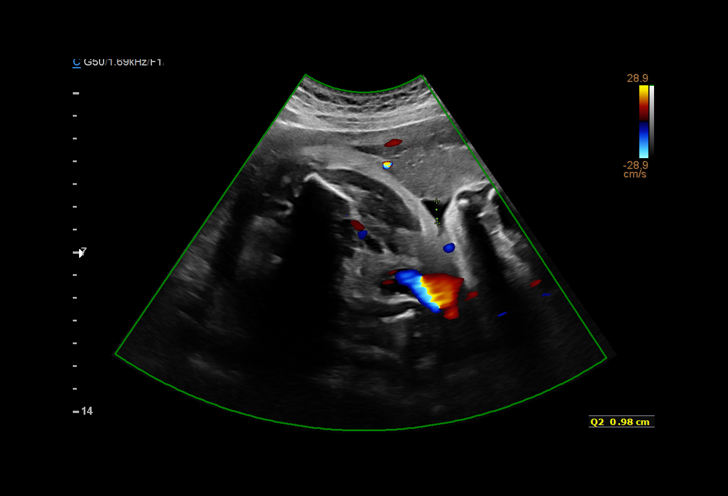
[im 38/42]
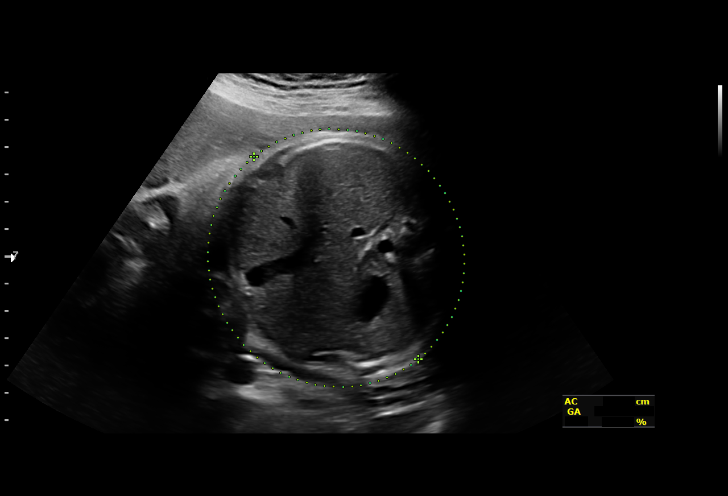
[im 42/42]
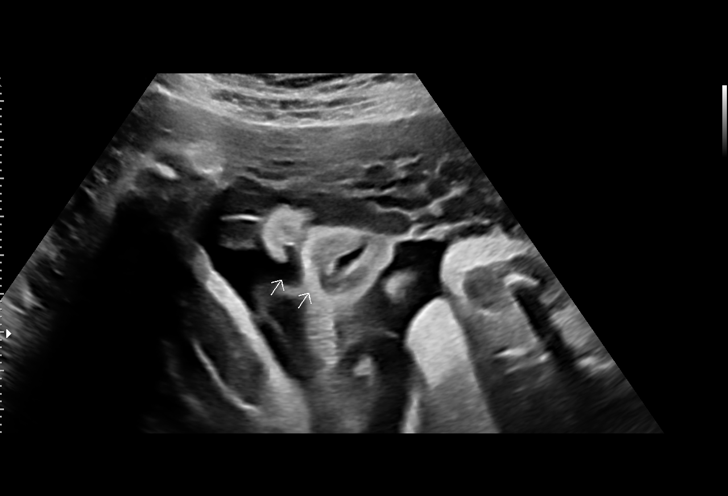

[14 of 28 positions shown; findings below may reference images not displayed]

Obstetrics &
                                                             Gynecology
                                                             [address]
                                                             [place].
                   CNM

                                                       [place]
 ----------------------------------------------------------------------

 ----------------------------------------------------------------------
Indications

  Encounter for other antenatal screening
  follow-up
  34 weeks gestation of pregnancy
 ----------------------------------------------------------------------
Fetal Evaluation

 Num Of Fetuses:          1
 Fetal Heart Rate(bpm):   133
 Cardiac Activity:        Observed
 Presentation:            Cephalic
 Placenta:                Anterior
 P. Cord Insertion:       Previously Visualized

 Amniotic Fluid
 AFI FV:      Within normal limits

 AFI Sum(cm)     %Tile       Largest Pocket(cm)
 8.65            9

 RUQ(cm)       RLQ(cm)       LUQ(cm)        LLQ(cm)

Biometry

 BPD:      85.2  mm     G. Age:  34w 2d         50  %    CI:        78.07   %    70 - 86
                                                         FL/HC:       20.8  %    19.4 -
 HC:      305.1  mm     G. Age:  34w 0d         11  %    HC/AC:       1.03       0.96 -
 AC:      295.2  mm     G. Age:  33w 4d         31  %    FL/BPD:      74.4  %    71 - 87
 FL:       63.4  mm     G. Age:  32w 5d         10  %    FL/AC:       21.5  %    20 - 24
 HUM:      56.2  mm     G. Age:  32w 5d         27  %

 LV:        2.8  mm

 Est. FW:    7686   gm   4 lb 13 oz      21  %
OB History

 Gravidity:    1         Term:   0        Prem:   0        SAB:   0
 TOP:          0       Ectopic:  0        Living: 0
Gestational Age

 LMP:           36w 1d        Date:  02/12/19                 EDD:   11/19/19
 U/S Today:     33w 5d                                        EDD:   12/06/19
 Best:          34w 2d     Det. By:  Early Ultrasound         EDD:   12/02/19
                                     (06/18/19)
Anatomy

 Cranium:               Appears normal         Aortic Arch:            Previously seen
 Cavum:                 Previously seen        Ductal Arch:            Previously seen
 Ventricles:            Appears normal         Diaphragm:              Previously seen
 Choroid Plexus:        Previously seen        Stomach:                Appears normal, left
                                                                       sided
 Cerebellum:            Previously seen        Abdomen:                Previously seen
 Posterior Fossa:       Previously seen        Abdominal Wall:         Previously seen
 Nuchal Fold:           Not applicable (>20    Cord Vessels:           Previously seen
                        wks GA)
 Face:                  orbits prev seen;      Kidneys:                Appear normal
                        profile nl
 Lips:                  Previously seen        Bladder:                Appears normal
 Thoracic:              Appears normal         Spine:                  Previously seen
 Heart:                 Appears normal         Upper Extremities:      Previously seen
                        (4CH, axis, and
                        situs)
 RVOT:                  Previously seen        Lower Extremities:      Previously seen
 LVOT:                  Previously seen

 Other:  Technically difficult due to fetal position.
Cervix Uterus Adnexa

 Cervix
 Not visualized (advanced GA >14wks)
Impression

 Follow up anatomy performed today
 Normal interval growth
 Anatomy now complete
Recommendations

 Follow up as clinically indicated.

## 2020-07-26 NOTE — L&D Delivery Note (Signed)
Delivery Note At 11:16 PM a viable female was delivered via Vaginal, Spontaneous (Presentation: Right Occiput Anterior).  APGAR: , ; weight pending.  After 1 minute, the cord was clamped and cut. 40 units of pitocin diluted in 1000cc LR was infused rapidly IV.  The placenta separated spontaneously and delivered via CCT and maternal pushing effort.  It was inspected and appears to be intact with a 3 VC.   Anesthesia: Epidural Episiotomy: None Lacerations: None Suture Repair:  Est. Blood Loss (mL): 100  Mom to postpartum.  Baby to Couplet care / Skin to Skin.  Due to having an imminent delivery in another room , Dr. Nelda Marseille came and managed 3rd stage and placed Liletta IUD.   Kathryn Melendez 04/30/2021, 11:52 PM

## 2020-11-13 ENCOUNTER — Telehealth: Payer: 59

## 2020-11-13 ENCOUNTER — Telehealth (INDEPENDENT_AMBULATORY_CARE_PROVIDER_SITE_OTHER): Payer: Medicaid Other

## 2020-11-13 DIAGNOSIS — Z3482 Encounter for supervision of other normal pregnancy, second trimester: Secondary | ICD-10-CM

## 2020-11-13 DIAGNOSIS — Z3480 Encounter for supervision of other normal pregnancy, unspecified trimester: Secondary | ICD-10-CM

## 2020-11-13 DIAGNOSIS — Z3490 Encounter for supervision of normal pregnancy, unspecified, unspecified trimester: Secondary | ICD-10-CM | POA: Insufficient documentation

## 2020-11-13 MED ORDER — BLOOD PRESSURE KIT DEVI
0 refills | Status: DC
Start: 2020-11-13 — End: 2021-06-11

## 2020-11-13 MED ORDER — BLOOD PRESSURE KIT DEVI: 0 refills | Status: DC

## 2020-11-13 MED ORDER — GOJJI WEIGHT SCALE MISC: 0 refills | Status: DC

## 2020-11-13 NOTE — Progress Notes (Signed)
  Virtual Visit via Telephone Note  I connected with Kathryn Melendez on 11/13/20 at 10:20 AM EDT by telephone and verified that I am speaking with the correct person using two identifiers.  Location: Patient: Home  Provider: Merla Riches    I discussed the limitations, risks, security and privacy concerns of performing an evaluation and management service by telephone and the availability of in person appointments. I also discussed with the patient that there may be a patient responsible charge related to this service. The patient expressed understanding and agreed to proceed.   History of Present Illness: PRENATAL INTAKE SUMMARY  Kathryn Melendez presents today New OB Nurse Interview.  OB History    Gravida  2   Para  1   Term  1   Preterm      AB      Living  1     SAB      IAB      Ectopic      Multiple  0   Live Births  1          I have reviewed the patient's medical, obstetrical, social, and family histories, medications, and available lab results.  SUBJECTIVE She has no unusual complaints   Observations/Objective: Initial nurse interview for history/labs (New OB)  EDD: 04/27/2021 - u/s completed at [facility] [place]: [redacted]w[redacted]d G2P1 FHT: non face to face interview   GENERAL APPEARANCE: oriented to person, place and time, non face to face interview   Assessment and Plan: Normal pregnancy Prenatal care: [facility] to be completed at next visit Download Babyscripts  Sign up for Mychart Pick up BP cuff and scale from [place] = 3 GAD7 = 5   Follow Up Instructions:   I discussed the assessment and treatment plan with the patient. The patient was provided an opportunity to ask questions and all were answered. The patient agreed with the plan and demonstrated an understanding of the instructions.   The patient was advised to call back or seek an in-person evaluation if the symptoms worsen or if the condition fails to improve as anticipated.  I  provided 20 minutes of non-face-to-face time during this encounter.   Maryruth Eve, CMA

## 2020-11-14 NOTE — Progress Notes (Signed)
Patient was assessed and managed by nursing staff during this encounter. I have reviewed the chart and agree with the documentation and plan. I have also made any necessary editorial changes.  Griffin Basil, MD 11/14/2020 8:40 AM

## 2020-11-20 ENCOUNTER — Other Ambulatory Visit (HOSPITAL_COMMUNITY)
Admission: RE | Admit: 2020-11-20 | Discharge: 2020-11-20 | Disposition: A | Discharge status: Home / Self Care | Payer: Medicaid Other | Source: Ambulatory Visit | Attending: Obstetrics | Admitting: Obstetrics

## 2020-11-20 ENCOUNTER — Other Ambulatory Visit: Payer: Self-pay

## 2020-11-20 ENCOUNTER — Ambulatory Visit (INDEPENDENT_AMBULATORY_CARE_PROVIDER_SITE_OTHER): Payer: Medicaid Other | Admitting: Obstetrics

## 2020-11-20 ENCOUNTER — Encounter: Payer: Self-pay | Admitting: Obstetrics

## 2020-11-20 VITALS — BP 97/61 | HR 79 | Wt 144.0 lb

## 2020-11-20 DIAGNOSIS — Z3480 Encounter for supervision of other normal pregnancy, unspecified trimester: Secondary | ICD-10-CM

## 2020-11-20 DIAGNOSIS — J301 Allergic rhinitis due to pollen: Secondary | ICD-10-CM

## 2020-11-20 LAB — CERVICOVAGINAL ANCILLARY ONLY
Chlamydia: NEGATIVE
Comment: NEGATIVE
Comment: NORMAL
Neisseria Gonorrhea: NEGATIVE

## 2020-11-20 LAB — OB RESULTS CONSOLE GC/CHLAMYDIA: Gonorrhea: NEGATIVE

## 2020-11-20 MED ORDER — VITAFOL ULTRA 29-0.6-0.4-200 MG PO CAPS: 1.0000 | ORAL_CAPSULE | Freq: Every day | ORAL | 4 refills | Status: AC

## 2020-11-20 MED ORDER — LORATADINE 10 MG PO TABS: 10.0000 mg | ORAL_TABLET | Freq: Every day | ORAL | 11 refills | Status: DC

## 2020-11-20 NOTE — Addendum Note (Signed)
Addended by: Lewie Loron D on: 11/20/2020 11:30 AM   Modules accepted: Orders

## 2020-11-20 NOTE — Progress Notes (Addendum)
Subjective:    Kathryn Melendez is being seen today for her first obstetrical visit.  This is not a planned pregnancy. She is at [redacted]w[redacted]d gestation. Her obstetrical history is significant for none. Relationship with FOB: significant other, not living together. Patient does intend to breast feed. Pregnancy history fully reviewed.  The information documented in the HPI was reviewed and verified.  Menstrual History: OB History    Gravida  2   Para  1   Term  1   Preterm      AB      Living  1     SAB      IAB      Ectopic      Multiple  0   Live Births  1            Patient's last menstrual period was 07/06/2020.    Past Medical History:  Diagnosis Date  . Anxiety     Past Surgical History:  Procedure Laterality Date  . MOUTH SURGERY      (Not in a hospital admission)  Allergies  Allergen Reactions  . Penicillins     rash    Social History   Tobacco Use  . Smoking status: Never Smoker  . Smokeless tobacco: Never Used  Substance Use Topics  . Alcohol use: No    Family History  Problem Relation Age of Onset  . Cancer Maternal Grandmother   . Cancer Paternal Grandmother      Review of Systems Constitutional: negative for weight loss Gastrointestinal: negative for vomiting Genitourinary:negative for genital lesions and vaginal discharge and dysuria Musculoskeletal:negative for back pain Behavioral/Psych: negative for abusive relationship, depression, illegal drug usage and tobacco use    Objective:    LMP 07/06/2020  General Appearance:    Alert, cooperative, no distress, appears stated age  Head:    Normocephalic, without obvious abnormality, atraumatic  Eyes:    PERRL, conjunctiva/corneas clear, EOM's intact, fundi    benign, both eyes  Ears:    Normal TM's and external ear canals, both ears  Nose:   Nares normal, septum midline, mucosa normal, no drainage    or sinus tenderness  Throat:   Lips, mucosa, and tongue normal; teeth and gums normal   Neck:   Supple, symmetrical, trachea midline, no adenopathy;    thyroid:  no enlargement/tenderness/nodules; no carotid   bruit or JVD  Back:     Symmetric, no curvature, ROM normal, no CVA tenderness  Lungs:     Clear to auscultation bilaterally, respirations unlabored  Chest Wall:    No tenderness or deformity   Heart:    Regular rate and rhythm, S1 and S2 normal, no murmur, rub   or gallop  Breast Exam:    No tenderness, masses, or nipple abnormality  Abdomen:     Soft, non-tender, bowel sounds active all four quadrants,    no masses, no organomegaly  Genitalia:    Normal female without lesion, discharge or tenderness  Extremities:   Extremities normal, atraumatic, no cyanosis or edema  Pulses:   2+ and symmetric all extremities  Skin:   Skin color, texture, turgor normal, no rashes or lesions  Lymph nodes:   Cervical, supraclavicular, and axillary nodes normal  Neurologic:   CNII-XII intact, normal strength, sensation and reflexes    throughout      Lab Review Urine pregnancy test Labs reviewed yes Radiologic studies reviewed yes  Assessment:    Pregnancy at [redacted]w[redacted]d weeks  Plan:     1. Supervision of other normal pregnancy, antepartum Rx: - Genetic Screening - CBC/D/Plt+RPR+Rh+ABO+Rub Ab... - Cervicovaginal ancillary only( [place]) - Culture, OB Urine - [place] MFM OB Complete +14 WK; Future - Prenat-Fe Poly-Methfol-FA-DHA (VITAFOL ULTRA) 29-0.6-0.4-200 MG CAPS; Take 1 capsule by mouth daily before breakfast.  Dispense: 90 capsule; Refill: 4  2. Seasonal allergic rhinitis due to pollen Rx: - loratadine (CLARITIN) 10 MG tablet; Take 1 tablet (10 mg total) by mouth daily.  Dispense: 30 tablet; Refill: 11   Prenatal vitamins.  Counseling provided regarding continued use of seat belts, cessation of alcohol consumption, smoking or use of illicit drugs; infection precautions i.e., influenza/TDAP immunizations, toxoplasmosis,CMV, parvovirus, listeria and varicella;  workplace safety, exercise during pregnancy; routine dental care, safe medications, sexual activity, hot tubs, saunas, pools, travel, caffeine use, fish and methlymercury, potential toxins, hair treatments, varicose veins Weight gain recommendations per IOM guidelines reviewed: underweight/BMI< 18.5--> gain 28 - 40 lbs; normal weight/BMI 18.5 - 24.9--> gain 25 - 35 lbs; overweight/BMI 25 - 29.9--> gain 15 - 25 lbs; obese/BMI >30->gain  11 - 20 lbs Problem list reviewed and updated. FIRST/CF mutation testing/NIPT/QUAD SCREEN/fragile X/Ashkenazi Jewish population testing/Spinal muscular atrophy discussed: requested. Role of ultrasound in pregnancy discussed; fetal survey: requested. Amniocentesis discussed: not indicated  Follow up in 4 weeks.   I have spent a total of 20 minutes of face-to-face time, excluding clinical staff time, reviewing notes and preparing to see patient, ordering tests and/or medications, and counseling the patient.  Shelly Bombard, MD 11/20/2020 11:07 AM

## 2020-11-20 NOTE — Addendum Note (Signed)
Addended by: Baltazar Najjar A on: 11/20/2020 11:37 AM   Modules accepted: Orders

## 2020-11-22 LAB — AFP, SERUM, OPEN SPINA BIFIDA
AFP MoM: 0.86
AFP Value: 36.8 ng/mL
Gest. Age on Collection Date: 17.3 weeks
Maternal Age At EDD: 20.8 yr
OSBR Risk 1 IN: 10000
Test Results:: NEGATIVE
Weight: 144 [lb_av]

## 2020-11-22 LAB — CBC/D/PLT+RPR+RH+ABO+RUB AB...
Antibody Screen: NEGATIVE
Basophils Absolute: 0.1 10*3/uL (ref 0.0–0.2)
Basos: 1 %
EOS (ABSOLUTE): 0.1 10*3/uL (ref 0.0–0.4)
Eos: 1 %
HCV Ab: <0.1 s/co ratio (ref 0.0–0.9)
HIV Screen 4th Generation wRfx: NONREACTIVE
Hematocrit: 34.2 % (ref 34.0–46.6)
Hemoglobin: 11.9 g/dL (ref 11.1–15.9)
Hepatitis B Surface Ag: NEGATIVE
Immature Grans (Abs): 0 10*3/uL (ref 0.0–0.1)
Immature Granulocytes: 0 %
Lymphocytes Absolute: 1.9 10*3/uL (ref 0.7–3.1)
Lymphs: 15 %
MCH: 30.6 pg (ref 26.6–33.0)
MCHC: 34.8 g/dL (ref 31.5–35.7)
MCV: 88 fL (ref 79–97)
Monocytes Absolute: 0.7 10*3/uL (ref 0.1–0.9)
Monocytes: 6 %
Neutrophils Absolute: 9.9 10*3/uL — ABNORMAL HIGH (ref 1.4–7.0)
Neutrophils: 77 %
Platelets: 215 10*3/uL (ref 150–450)
RBC: 3.89 x10E6/uL (ref 3.77–5.28)
RDW: 13.1 % (ref 11.7–15.4)
RPR Ser Ql: NONREACTIVE
Rh Factor: POSITIVE
Rubella Antibodies, IGG: <0.9 index — ABNORMAL LOW (ref >0.99)
WBC: 12.7 10*3/uL — ABNORMAL HIGH (ref 3.4–10.8)

## 2020-11-22 LAB — CULTURE, OB URINE

## 2020-11-22 LAB — URINE CULTURE, OB REFLEX

## 2020-11-22 LAB — HCV INTERPRETATION

## 2020-11-27 ENCOUNTER — Encounter: Payer: Self-pay | Admitting: Obstetrics

## 2020-12-08 ENCOUNTER — Ambulatory Visit: Payer: Medicaid Other

## 2020-12-08 ENCOUNTER — Encounter: Payer: Self-pay | Admitting: Obstetrics

## 2020-12-09 ENCOUNTER — Ambulatory Visit: Payer: Medicaid Other | Attending: Obstetrics

## 2020-12-09 ENCOUNTER — Other Ambulatory Visit: Payer: Self-pay

## 2020-12-09 ENCOUNTER — Other Ambulatory Visit: Payer: Self-pay | Admitting: *Deleted

## 2020-12-09 DIAGNOSIS — Z3687 Encounter for antenatal screening for uncertain dates: Secondary | ICD-10-CM | POA: Diagnosis not present

## 2020-12-09 DIAGNOSIS — Z3480 Encounter for supervision of other normal pregnancy, unspecified trimester: Secondary | ICD-10-CM | POA: Diagnosis not present

## 2020-12-09 DIAGNOSIS — Z3A20 20 weeks gestation of pregnancy: Secondary | ICD-10-CM | POA: Diagnosis not present

## 2020-12-09 DIAGNOSIS — O359XX0 Maternal care for (suspected) fetal abnormality and damage, unspecified, not applicable or unspecified: Secondary | ICD-10-CM

## 2020-12-09 DIAGNOSIS — O09892 Supervision of other high risk pregnancies, second trimester: Secondary | ICD-10-CM

## 2020-12-09 DIAGNOSIS — Z362 Encounter for other antenatal screening follow-up: Secondary | ICD-10-CM

## 2020-12-18 ENCOUNTER — Encounter: Payer: Medicaid Other | Admitting: Family Medicine

## 2021-01-06 ENCOUNTER — Ambulatory Visit: Payer: Medicaid Other | Attending: Obstetrics

## 2021-01-06 ENCOUNTER — Ambulatory Visit: Payer: Medicaid Other | Admitting: *Deleted

## 2021-01-06 ENCOUNTER — Encounter: Payer: Medicaid Other | Admitting: Advanced Practice Midwife

## 2021-01-06 ENCOUNTER — Other Ambulatory Visit: Payer: Self-pay

## 2021-01-06 ENCOUNTER — Encounter: Payer: Self-pay | Admitting: *Deleted

## 2021-01-06 VITALS — BP 96/49 | HR 74

## 2021-01-06 DIAGNOSIS — Z362 Encounter for other antenatal screening follow-up: Secondary | ICD-10-CM | POA: Diagnosis not present

## 2021-01-06 DIAGNOSIS — Z3492 Encounter for supervision of normal pregnancy, unspecified, second trimester: Secondary | ICD-10-CM | POA: Insufficient documentation

## 2021-01-06 DIAGNOSIS — O09892 Supervision of other high risk pregnancies, second trimester: Secondary | ICD-10-CM | POA: Diagnosis not present

## 2021-01-06 DIAGNOSIS — Z3A24 24 weeks gestation of pregnancy: Secondary | ICD-10-CM | POA: Diagnosis not present

## 2021-02-23 ENCOUNTER — Other Ambulatory Visit (HOSPITAL_COMMUNITY)
Admission: RE | Admit: 2021-02-23 | Discharge: 2021-02-23 | Disposition: A | Discharge status: Home / Self Care | Payer: Medicaid Other | Source: Ambulatory Visit | Attending: Obstetrics and Gynecology | Admitting: Obstetrics and Gynecology

## 2021-02-23 ENCOUNTER — Ambulatory Visit (INDEPENDENT_AMBULATORY_CARE_PROVIDER_SITE_OTHER): Payer: Medicaid Other | Admitting: Family Medicine

## 2021-02-23 ENCOUNTER — Other Ambulatory Visit: Payer: Self-pay

## 2021-02-23 VITALS — BP 120/73 | HR 68 | Wt 162.8 lb

## 2021-02-23 DIAGNOSIS — Z3480 Encounter for supervision of other normal pregnancy, unspecified trimester: Secondary | ICD-10-CM

## 2021-02-23 DIAGNOSIS — Z23 Encounter for immunization: Secondary | ICD-10-CM

## 2021-02-23 DIAGNOSIS — R300 Dysuria: Secondary | ICD-10-CM

## 2021-02-23 DIAGNOSIS — N898 Other specified noninflammatory disorders of vagina: Secondary | ICD-10-CM

## 2021-02-23 DIAGNOSIS — Z3A31 31 weeks gestation of pregnancy: Secondary | ICD-10-CM

## 2021-02-23 LAB — POCT URINALYSIS DIPSTICK
Appearance: NORMAL
Bilirubin, UA: NEGATIVE
Blood, UA: NEGATIVE
Glucose, UA: NEGATIVE
Ketones, UA: NEGATIVE
Nitrite, UA: NEGATIVE
Protein, UA: NEGATIVE
Spec Grav, UA: 1.015 (ref 1.010–1.025)
Urobilinogen, UA: 0.2 E.U./dL
pH, UA: 7.5 (ref 5.0–8.0)

## 2021-02-23 NOTE — Progress Notes (Signed)
   Subjective:  Kathryn Melendez is a 20 y.o. G2P1001 at 50w0dbeing seen today for ongoing prenatal care.  She is currently monitored for the following issues for this low-risk pregnancy and has GERD; Greater trochanteric bursitis of right hip; Post term pregnancy, 41 weeks; SVD (5/16); Postpartum care following vaginal delivery; and Encounter for supervision of normal pregnancy, antepartum on their problem list.  Patient reports  vaginal discharge and dysuria .  Contractions: Not present. Vag. Bleeding: None.  Movement: Present. Denies leaking of fluid.   Also reports some white peeling of labia majora but thinks it may be her toilet paper.   The following portions of the patient's history were reviewed and updated as appropriate: allergies, current medications, past family history, past medical history, past social history, past surgical history and problem list. Problem list updated.  Objective:   Vitals:   02/23/21 1547  BP: 120/73  Pulse: 68  Weight: 162 lb 12.8 oz (73.8 kg)    Fetal Status: Fetal Heart Rate (bpm): 128 Fundal Height: 31 cm Movement: Present     General:  Alert, oriented and cooperative. Patient is in no acute distress.  Skin: Skin is warm and dry. No rash noted.   Cardiovascular: Normal heart rate noted  Respiratory: Normal respiratory effort, no problems with respiration noted  Abdomen: Soft, gravid, appropriate for gestational age. Pain/Pressure: Absent     Pelvic: Vag. Bleeding: None   , on exam some thin white discharge at introitus and no evidence of peeling or scaling of skin on labia majora and minora Cervical exam deferred        Extremities: Normal range of motion.  Edema: None  Mental Status: Normal mood and affect. Normal behavior. Normal judgment and thought content.    Assessment and Plan:  Pregnancy: G2P1001 at 328w0d1. Supervision of other normal pregnancy, antepartum 2. [redacted] weeks gestation of pregnancy Missed last two appointments due to car  issues. Pregnancy progressing as expected. Discussed appointments becoming more frequent since third trimester and patient expressed understanding - nurse visit scheduled for 2 hr GTT - Tdap vaccine greater than or equal to 7yo IM  3. Dysuria Patient reports had dysuria last week that has mostly resolved but still lingering. Took Azo which helped. - Urine Culture - POCT urinalysis dipstick  4. Vaginal discharge Patient reports some thickening of vaginal discharge - [place] Preterm labor symptoms and general obstetric precautions including but not limited to vaginal bleeding, contractions, leaking of fluid and fetal movement were reviewed in detail with the patient. Please refer to After Visit Summary for other counseling recommendations.  Return in about 2 weeks (around 03/09/2021) for ROB, low risk.   AnRenard MatterMD, MPH OB Fellow, Faculty Practice

## 2021-02-23 NOTE — Progress Notes (Signed)
Yell+ fetal movement. No complaints.

## 2021-02-25 LAB — CERVICOVAGINAL ANCILLARY ONLY
Bacterial Vaginitis (gardnerella): NEGATIVE
Candida Glabrata: NEGATIVE
Candida Vaginitis: POSITIVE — AB
Chlamydia: NEGATIVE
Comment: NEGATIVE
Comment: NEGATIVE
Comment: NEGATIVE
Comment: NEGATIVE
Comment: NEGATIVE
Comment: NORMAL
Neisseria Gonorrhea: NEGATIVE
Trichomonas: NEGATIVE

## 2021-02-25 LAB — URINE CULTURE

## 2021-02-26 ENCOUNTER — Other Ambulatory Visit: Payer: Self-pay | Admitting: Family Medicine

## 2021-02-26 MED ORDER — FLUCONAZOLE 150 MG PO TABS: 150.0000 mg | ORAL_TABLET | Freq: Every day | ORAL | 0 refills | Status: DC

## 2021-02-26 NOTE — Progress Notes (Signed)
Yeas infection treatment sent to patient's pharmacy.  Renard Matter, MD, MPH OB Fellow, Faculty Practice

## 2021-03-02 ENCOUNTER — Other Ambulatory Visit: Payer: Self-pay

## 2021-03-02 ENCOUNTER — Other Ambulatory Visit: Payer: Medicaid Other

## 2021-03-02 DIAGNOSIS — Z3480 Encounter for supervision of other normal pregnancy, unspecified trimester: Secondary | ICD-10-CM

## 2021-03-02 MED ORDER — FLUCONAZOLE 150 MG PO TABS: 150.0000 mg | ORAL_TABLET | Freq: Every day | ORAL | 0 refills | Status: DC

## 2021-03-03 LAB — CBC
Hematocrit: 37.1 % (ref 34.0–46.6)
Hemoglobin: 12.6 g/dL (ref 11.1–15.9)
MCH: 30.4 pg (ref 26.6–33.0)
MCHC: 34 g/dL (ref 31.5–35.7)
MCV: 89 fL (ref 79–97)
Platelets: 158 10*3/uL (ref 150–450)
RBC: 4.15 x10E6/uL (ref 3.77–5.28)
RDW: 12.3 % (ref 11.7–15.4)
WBC: 8.4 10*3/uL (ref 3.4–10.8)

## 2021-03-03 LAB — GLUCOSE TOLERANCE, 2 HOURS W/ 1HR
Glucose, 1 hour: 127 mg/dL (ref 65–179)
Glucose, 2 hour: 79 mg/dL (ref 65–152)
Glucose, Fasting: 70 mg/dL (ref 65–91)

## 2021-03-03 LAB — RPR: RPR Ser Ql: NONREACTIVE

## 2021-03-03 LAB — HIV ANTIBODY (ROUTINE TESTING W REFLEX): HIV Screen 4th Generation wRfx: NONREACTIVE

## 2021-03-09 ENCOUNTER — Encounter: Payer: Medicaid Other | Admitting: Advanced Practice Midwife

## 2021-04-08 ENCOUNTER — Other Ambulatory Visit (HOSPITAL_COMMUNITY)
Admission: RE | Admit: 2021-04-08 | Discharge: 2021-04-08 | Disposition: A | Discharge status: Home / Self Care | Payer: Medicaid Other | Source: Ambulatory Visit | Attending: Advanced Practice Midwife | Admitting: Advanced Practice Midwife

## 2021-04-08 ENCOUNTER — Encounter: Payer: Self-pay | Admitting: Obstetrics

## 2021-04-08 ENCOUNTER — Ambulatory Visit (INDEPENDENT_AMBULATORY_CARE_PROVIDER_SITE_OTHER): Payer: Medicaid Other | Admitting: Obstetrics

## 2021-04-08 ENCOUNTER — Other Ambulatory Visit: Payer: Self-pay

## 2021-04-08 VITALS — BP 116/74 | HR 78 | Wt 175.8 lb

## 2021-04-08 DIAGNOSIS — Z3480 Encounter for supervision of other normal pregnancy, unspecified trimester: Secondary | ICD-10-CM

## 2021-04-08 NOTE — Progress Notes (Signed)
Subjective:  Kathryn Melendez is a 20 y.o. G2P1001 at 30w2dbeing seen today for ongoing prenatal care.  She is currently monitored for the following issues for this low-risk pregnancy and has GERD; Greater trochanteric bursitis of right hip; Post term pregnancy, 41 weeks; SVD (5/16); Postpartum care following vaginal delivery; and Encounter for supervision of normal pregnancy, antepartum on their problem list.  Patient reports backache.  Contractions: Irritability. Vag. Bleeding: None.  Movement: Present. Denies leaking of fluid.   The following portions of the patient's history were reviewed and updated as appropriate: allergies, current medications, past family history, past medical history, past social history, past surgical history and problem list. Problem list updated.  Objective:   Vitals:   04/08/21 0854  BP: 116/74  Pulse: 78  Weight: 175 lb 12.8 oz (79.7 kg)    Fetal Status:     Movement: Present     General:  Alert, oriented and cooperative. Patient is in no acute distress.  Skin: Skin is warm and dry. No rash noted.   Cardiovascular: Normal heart rate noted  Respiratory: Normal respiratory effort, no problems with respiration noted  Abdomen: Soft, gravid, appropriate for gestational age. Pain/Pressure: Present     Pelvic:  Cervical exam performed      2cm / 50% / -3 / Vttx  Extremities: Normal range of motion.  Edema: Trace  Mental Status: Normal mood and affect. Normal behavior. Normal judgment and thought content.   Urinalysis:      Assessment and Plan:  Pregnancy: G2P1001 at 375w2d1. Supervision of other normal pregnancy, antepartum   Term labor symptoms and general obstetric precautions including but not limited to vaginal bleeding, contractions, leaking of fluid and fetal movement were reviewed in detail with the patient. Please refer to After Visit Summary for other counseling recommendations.   Return in about 1 week (around 04/15/2021) for ROB.   HaShelly BombardMD  04/08/21

## 2021-04-08 NOTE — Progress Notes (Signed)
Pt reports fetal movement with some irritability and pressure.

## 2021-04-09 LAB — CERVICOVAGINAL ANCILLARY ONLY
Bacterial Vaginitis (gardnerella): NEGATIVE
Candida Glabrata: NEGATIVE
Candida Vaginitis: NEGATIVE
Chlamydia: NEGATIVE
Comment: NEGATIVE
Comment: NEGATIVE
Comment: NEGATIVE
Comment: NEGATIVE
Comment: NEGATIVE
Comment: NORMAL
Neisseria Gonorrhea: NEGATIVE
Trichomonas: NEGATIVE

## 2021-04-10 LAB — STREP GP B NAA: Strep Gp B NAA: NEGATIVE

## 2021-04-22 ENCOUNTER — Encounter: Payer: Self-pay | Admitting: Obstetrics

## 2021-04-22 ENCOUNTER — Other Ambulatory Visit: Payer: Self-pay

## 2021-04-22 ENCOUNTER — Ambulatory Visit (INDEPENDENT_AMBULATORY_CARE_PROVIDER_SITE_OTHER): Payer: Medicaid Other | Admitting: Obstetrics

## 2021-04-22 VITALS — BP 110/71 | HR 81 | Wt 176.0 lb

## 2021-04-22 DIAGNOSIS — Z3480 Encounter for supervision of other normal pregnancy, unspecified trimester: Secondary | ICD-10-CM

## 2021-04-22 NOTE — Addendum Note (Signed)
Addended by: Baltazar Najjar A on: 04/22/2021 11:18 AM   Modules accepted: Orders, SmartSet

## 2021-04-22 NOTE — Progress Notes (Signed)
Subjective:  Kathryn Melendez is a 20 y.o. G2P1001 at [redacted]w[redacted]d being seen today for ongoing prenatal care.  She is currently monitored for the following issues for this low-risk pregnancy and has GERD; Greater trochanteric bursitis of right hip; Post term pregnancy, 41 weeks; SVD (5/16); Postpartum care following vaginal delivery; and Encounter for supervision of normal pregnancy, antepartum on their problem list.  Patient reports occasional contractions.  Contractions: Irregular. Vag. Bleeding: None.  Movement: Present. Denies leaking of fluid.   The following portions of the patient's history were reviewed and updated as appropriate: allergies, current medications, past family history, past medical history, past social history, past surgical history and problem list. Problem list updated.  Objective:   Vitals:   04/22/21 0952  BP: 110/71  Pulse: 81  Weight: 176 lb (79.8 kg)    Fetal Status:     Movement: Present     General:  Alert, oriented and cooperative. Patient is in no acute distress.  Skin: Skin is warm and dry. No rash noted.   Cardiovascular: Normal heart rate noted  Respiratory: Normal respiratory effort, no problems with respiration noted  Abdomen: Soft, gravid, appropriate for gestational age. Pain/Pressure: Present     Pelvic:  Cervical exam performed :  2 cm / 50% / -3 / Vtx    Extremities: Normal range of motion.     Mental Status: Normal mood and affect. Normal behavior. Normal judgment and thought content.   Urinalysis:      Assessment and Plan:  Pregnancy: G2P1001 at [redacted]w[redacted]d  1. Supervision of other normal pregnancy, antepartum   Term labor symptoms and general obstetric precautions including but not limited to vaginal bleeding, contractions, leaking of fluid and fetal movement were reviewed in detail with the patient. Please refer to After Visit Summary for other counseling recommendations.   Return in about 1 week (around 04/29/2021) for [place].   Shelly Bombard, MD   04/22/21

## 2021-04-25 ENCOUNTER — Other Ambulatory Visit: Payer: Self-pay | Admitting: Advanced Practice Midwife

## 2021-04-27 ENCOUNTER — Telehealth (HOSPITAL_COMMUNITY): Payer: Self-pay | Admitting: *Deleted

## 2021-04-27 NOTE — Telephone Encounter (Signed)
Preadmission screen  

## 2021-04-28 ENCOUNTER — Telehealth (HOSPITAL_COMMUNITY): Payer: Self-pay | Admitting: *Deleted

## 2021-04-28 ENCOUNTER — Other Ambulatory Visit: Payer: Self-pay | Admitting: Advanced Practice Midwife

## 2021-04-28 ENCOUNTER — Encounter (HOSPITAL_COMMUNITY): Payer: Self-pay

## 2021-04-28 ENCOUNTER — Encounter (HOSPITAL_COMMUNITY): Payer: Self-pay | Admitting: *Deleted

## 2021-04-28 NOTE — Telephone Encounter (Signed)
Preadmission screen  

## 2021-04-29 ENCOUNTER — Encounter: Payer: Self-pay | Admitting: Obstetrics

## 2021-04-29 ENCOUNTER — Other Ambulatory Visit: Payer: Self-pay

## 2021-04-29 ENCOUNTER — Ambulatory Visit (INDEPENDENT_AMBULATORY_CARE_PROVIDER_SITE_OTHER): Payer: Medicaid Other | Admitting: Obstetrics

## 2021-04-29 VITALS — BP 129/77 | HR 73 | Wt 179.0 lb

## 2021-04-29 DIAGNOSIS — Z3480 Encounter for supervision of other normal pregnancy, unspecified trimester: Secondary | ICD-10-CM

## 2021-04-29 NOTE — Progress Notes (Signed)
Postdates OB, NST today.  C/o cramps 3-4/10 started today, decreased movements,

## 2021-04-29 NOTE — Progress Notes (Signed)
Subjective:  Kathryn Melendez is a 20 y.o. G2P1001 at [redacted]w[redacted]d being seen today for ongoing prenatal care.  She is currently monitored for the following issues for this low-risk pregnancy and has GERD; Greater trochanteric bursitis of right hip; Post term pregnancy, 41 weeks; SVD (5/16); Postpartum care following vaginal delivery; and Encounter for supervision of normal pregnancy, antepartum on their problem list.  Patient reports no complaints.   .  .   . Denies leaking of fluid.   The following portions of the patient's history were reviewed and updated as appropriate: allergies, current medications, past family history, past medical history, past social history, past surgical history and problem list. Problem list updated.  Objective:  There were no vitals filed for this visit.  Fetal Status:           General:  Alert, oriented and cooperative. Patient is in no acute distress.  Skin: Skin is warm and dry. No rash noted.   Cardiovascular: Normal heart rate noted  Respiratory: Normal respiratory effort, no problems with respiration noted  Abdomen: Soft, gravid, appropriate for gestational age.       Pelvic:  Cervical exam performed:  2 cm / 70% / -2 / Vtx        Extremities: Normal range of motion.     Mental Status: Normal mood and affect. Normal behavior. Normal judgment and thought content.   Urinalysis:      Assessment and Plan:  Pregnancy: G2P1001 at [redacted]w[redacted]d  1. Supervision of other normal pregnancy, antepartum Rx: - Fetal nonstress test:  Reactive Baseline 120 bpm with 15x15 accels, no decels.  Good variability.  No contractions.   Term labor symptoms and general obstetric precautions including but not limited to vaginal bleeding, contractions, leaking of fluid and fetal movement were reviewed in detail with the patient. Please refer to After Visit Summary for other counseling recommendations.   IOL scheduled for 05-04-2021   Shelly Bombard, MD  04/29/21

## 2021-04-30 ENCOUNTER — Inpatient Hospital Stay (EMERGENCY_DEPARTMENT_HOSPITAL)
Admission: AD | Admit: 2021-04-30 | Discharge: 2021-04-30 | Disposition: A | Discharge status: Home / Self Care | Payer: Medicaid Other | Source: Home / Self Care | Attending: Obstetrics and Gynecology | Admitting: Obstetrics and Gynecology

## 2021-04-30 ENCOUNTER — Other Ambulatory Visit: Payer: Self-pay

## 2021-04-30 ENCOUNTER — Inpatient Hospital Stay (HOSPITAL_COMMUNITY)
Admission: AD | Admit: 2021-04-30 | Discharge: 2021-05-02 | DRG: 807 | Disposition: A | Discharge status: Home / Self Care | Payer: Medicaid Other | Attending: Obstetrics & Gynecology | Admitting: Obstetrics & Gynecology

## 2021-04-30 ENCOUNTER — Encounter (HOSPITAL_COMMUNITY): Payer: Self-pay | Admitting: Family Medicine

## 2021-04-30 ENCOUNTER — Inpatient Hospital Stay (HOSPITAL_COMMUNITY): Payer: Medicaid Other | Admitting: Anesthesiology

## 2021-04-30 ENCOUNTER — Encounter (HOSPITAL_COMMUNITY): Payer: Self-pay | Admitting: Obstetrics and Gynecology

## 2021-04-30 DIAGNOSIS — O479 False labor, unspecified: Secondary | ICD-10-CM

## 2021-04-30 DIAGNOSIS — O471 False labor at or after 37 completed weeks of gestation: Secondary | ICD-10-CM | POA: Insufficient documentation

## 2021-04-30 DIAGNOSIS — Z20822 Contact with and (suspected) exposure to covid-19: Secondary | ICD-10-CM | POA: Diagnosis present

## 2021-04-30 DIAGNOSIS — Z3A40 40 weeks gestation of pregnancy: Secondary | ICD-10-CM | POA: Insufficient documentation

## 2021-04-30 DIAGNOSIS — Z30014 Encounter for initial prescription of intrauterine contraceptive device: Secondary | ICD-10-CM | POA: Diagnosis not present

## 2021-04-30 DIAGNOSIS — O26893 Other specified pregnancy related conditions, third trimester: Secondary | ICD-10-CM | POA: Diagnosis present

## 2021-04-30 DIAGNOSIS — O480 Post-term pregnancy: Secondary | ICD-10-CM | POA: Insufficient documentation

## 2021-04-30 DIAGNOSIS — Z23 Encounter for immunization: Secondary | ICD-10-CM | POA: Diagnosis not present

## 2021-04-30 DIAGNOSIS — O99892 Other specified diseases and conditions complicating childbirth: Secondary | ICD-10-CM

## 2021-04-30 DIAGNOSIS — Z3A41 41 weeks gestation of pregnancy: Secondary | ICD-10-CM | POA: Diagnosis present

## 2021-04-30 DIAGNOSIS — Z2839 Other underimmunization status: Secondary | ICD-10-CM

## 2021-04-30 DIAGNOSIS — Z30430 Encounter for insertion of intrauterine contraceptive device: Secondary | ICD-10-CM

## 2021-04-30 LAB — CBC
HCT: 44.9 % (ref 36.0–46.0)
Hemoglobin: 15.1 g/dL — ABNORMAL HIGH (ref 12.0–15.0)
MCH: 30.5 pg (ref 26.0–34.0)
MCHC: 33.6 g/dL (ref 30.0–36.0)
MCV: 90.7 fL (ref 80.0–100.0)
Platelets: 171 10*3/uL (ref 150–400)
RBC: 4.95 MIL/uL (ref 3.87–5.11)
RDW: 13.2 % (ref 11.5–15.5)
WBC: 14.6 10*3/uL — ABNORMAL HIGH (ref 4.0–10.5)
nRBC: 0 % (ref 0.0–0.2)

## 2021-04-30 LAB — RESP PANEL BY RT-PCR (FLU A&B, COVID) ARPGX2
Influenza A by PCR: NEGATIVE
Influenza B by PCR: NEGATIVE
SARS Coronavirus 2 by RT PCR: NEGATIVE

## 2021-04-30 LAB — TYPE AND SCREEN
ABO/RH(D): O POS
Antibody Screen: NEGATIVE

## 2021-04-30 MED ORDER — FENTANYL CITRATE (PF) 100 MCG/2ML IJ SOLN
100.0000 ug | INTRAMUSCULAR | Status: DC | PRN
  Filled 2021-04-30: qty 2

## 2021-04-30 MED ORDER — LIDOCAINE HCL (PF) 1 % IJ SOLN
INTRAMUSCULAR | Status: DC | PRN
  Administered 2021-04-30: 5 mL via EPIDURAL

## 2021-04-30 MED ORDER — OXYTOCIN BOLUS FROM INFUSION
333.0000 mL | Freq: Once | INTRAVENOUS | Status: AC
  Administered 2021-04-30: 333 mL via INTRAVENOUS

## 2021-04-30 MED ORDER — FENTANYL-BUPIVACAINE-NACL 0.5-0.125-0.9 MG/250ML-% EP SOLN
EPIDURAL | Status: DC | PRN
  Administered 2021-04-30: 12 mL/h via EPIDURAL

## 2021-04-30 MED ORDER — OXYTOCIN-SODIUM CHLORIDE 30-0.9 UT/500ML-% IV SOLN: 1.0000 m[IU]/min | INTRAVENOUS | Status: DC

## 2021-04-30 MED ORDER — EPHEDRINE 5 MG/ML INJ: 10.0000 mg | INTRAVENOUS | Status: DC | PRN

## 2021-04-30 MED ORDER — FENTANYL CITRATE (PF) 100 MCG/2ML IJ SOLN
50.0000 ug | INTRAMUSCULAR | Status: DC | PRN
  Administered 2021-04-30: 50 ug via INTRAVENOUS

## 2021-04-30 MED ORDER — OXYCODONE-ACETAMINOPHEN 5-325 MG PO TABS: 1.0000 | ORAL_TABLET | ORAL | Status: DC | PRN

## 2021-04-30 MED ORDER — MISOPROSTOL 25 MCG QUARTER TABLET: 25.0000 ug | ORAL_TABLET | ORAL | Status: DC | PRN

## 2021-04-30 MED ORDER — PHENYLEPHRINE 40 MCG/ML (10ML) SYRINGE FOR IV PUSH (FOR BLOOD PRESSURE SUPPORT): 80.0000 ug | PREFILLED_SYRINGE | INTRAVENOUS | Status: DC | PRN

## 2021-04-30 MED ORDER — ACETAMINOPHEN 325 MG PO TABS: 650.0000 mg | ORAL_TABLET | ORAL | Status: DC | PRN

## 2021-04-30 MED ORDER — LACTATED RINGERS IV SOLN: 500.0000 mL | Freq: Once | INTRAVENOUS | Status: DC

## 2021-04-30 MED ORDER — FENTANYL-BUPIVACAINE-NACL 0.5-0.125-0.9 MG/250ML-% EP SOLN
12.0000 mL/h | EPIDURAL | Status: DC | PRN
  Filled 2021-04-30: qty 250

## 2021-04-30 MED ORDER — ONDANSETRON HCL 4 MG/2ML IJ SOLN: 4.0000 mg | Freq: Four times a day (QID) | INTRAMUSCULAR | Status: DC | PRN

## 2021-04-30 MED ORDER — LEVONORGESTREL 20.1 MCG/DAY IU IUD
1.0000 | INTRAUTERINE_SYSTEM | Freq: Once | INTRAUTERINE | Status: AC
  Administered 2021-04-30: 1 via INTRAUTERINE
  Filled 2021-04-30: qty 1

## 2021-04-30 MED ORDER — SOD CITRATE-CITRIC ACID 500-334 MG/5ML PO SOLN: 30.0000 mL | ORAL | Status: DC | PRN

## 2021-04-30 MED ORDER — OXYCODONE-ACETAMINOPHEN 5-325 MG PO TABS: 2.0000 | ORAL_TABLET | ORAL | Status: DC | PRN

## 2021-04-30 MED ORDER — TERBUTALINE SULFATE 1 MG/ML IJ SOLN: 0.2500 mg | Freq: Once | INTRAMUSCULAR | Status: DC | PRN

## 2021-04-30 MED ORDER — LACTATED RINGERS IV SOLN: INTRAVENOUS | Status: DC

## 2021-04-30 MED ORDER — LIDOCAINE HCL (PF) 1 % IJ SOLN: 30.0000 mL | INTRAMUSCULAR | Status: DC | PRN

## 2021-04-30 MED ORDER — LACTATED RINGERS IV SOLN: 500.0000 mL | INTRAVENOUS | Status: DC | PRN

## 2021-04-30 MED ORDER — DIPHENHYDRAMINE HCL 50 MG/ML IJ SOLN: 12.5000 mg | INTRAMUSCULAR | Status: DC | PRN

## 2021-04-30 MED ORDER — OXYTOCIN-SODIUM CHLORIDE 30-0.9 UT/500ML-% IV SOLN
2.5000 [IU]/h | INTRAVENOUS | Status: DC
  Administered 2021-04-30: 2.5 [IU]/h via INTRAVENOUS
  Filled 2021-04-30: qty 500

## 2021-04-30 NOTE — Anesthesia Procedure Notes (Signed)
Epidural Patient location during procedure: OB Start time: 04/30/2021 7:25 PM End time: 04/30/2021 7:35 PM  Staffing Anesthesiologist: Barnet Glasgow, MD Performed: anesthesiologist   Preanesthetic Checklist Completed: patient identified, IV checked, site marked, risks and benefits discussed, surgical consent, monitors and equipment checked, pre-op evaluation and timeout performed  Epidural Patient position: sitting Prep: DuraPrep and site prepped and draped Patient monitoring: continuous pulse ox and blood pressure Approach: midline Location: L3-L4 Injection technique: LOR air  Needle:  Needle type: Tuohy  Needle gauge: 17 G Needle length: 9 cm and 9 Needle insertion depth: 6 cm Catheter type: closed end flexible Catheter size: 19 Gauge Catheter at skin depth: 12 cm Test dose: negative  Assessment Events: blood not aspirated, injection not painful, no injection resistance, no paresthesia and negative IV test  Additional Notes Patient identified. Risks/Benefits/Options discussed with patient including but not limited to bleeding, infection, nerve damage, paralysis, failed block, incomplete pain control, headache, blood pressure changes, nausea, vomiting, reactions to medication both or allergic, itching and postpartum back pain. Confirmed with bedside nurse the patient's most recent platelet count. Confirmed with patient that they are not currently taking any anticoagulation, have any bleeding history or any family history of bleeding disorders. Patient expressed understanding and wished to proceed. All questions were answered. Sterile technique was used throughout the entire procedure. Please see nursing notes for vital signs. Test dose was given through epidural needle and negative prior to continuing to dose epidural or start infusion. Warning signs of high block given to the patient including shortness of breath, tingling/numbness in hands, complete motor block, or any  concerning symptoms with instructions to call for help. Patient was given instructions on fall risk and not to get out of bed. All questions and concerns addressed with instructions to call with any issues. 1 Attempt (S) . Patient tolerated procedure well.

## 2021-04-30 NOTE — H&P (Signed)
Kathryn Melendez is a 20 y.o. female G2P1001 with IUP at 46w3dby UKore[place]resenting for labor. Was seen earlier today and sent home, now more active.  She reports positive fetal movement. She denies leakage of fluid or vaginal bleeding.  [facility]  - Past Medical History: Past Medical History:  Diagnosis Date   Anxiety     Past Surgical History: Past Surgical History:  Procedure Laterality Date   MOUTH SURGERY      Obstetrical History: OB History     Gravida  2   Para  1   Term  1   Preterm      AB      Living  1      SAB      IAB      Ectopic      Multiple  0   Live Births  1            Social History: Social History   Socioeconomic History   Marital status: Single    Spouse name: Not on file   Number of children: Not on file   Years of education: Not on file   Highest education level: Not on file  Occupational History   Not on file  Tobacco Use   Smoking status: Never   Smokeless tobacco: Never  Vaping Use   Vaping Use: Former   Quit date: 10/24/2020   Substances: Nicotine, Flavoring  Substance and Sexual Activity   Alcohol use: No   Drug use: No   Sexual activity: Yes    Birth control/protection: None  Other Topics Concern   Not on file  Social History Narrative   Not on file   Social Determinants of Health   Financial Resource Strain: Not on file  Food Insecurity: Not on file  Transportation Needs: Not on file  Physical Activity: Not on file  Stress: Not on file  Social Connections: Not on file    Family History: Family History  Problem Relation Age of Onset   Cancer Maternal Grandmother    Cancer Paternal Grandmother     Allergies: Allergies  Allergen Reactions   Penicillins     rash    Medications Prior to Admission  Medication Sig Dispense Refill Last Dose   Prenat-Fe Poly-Methfol-FA-DHA (VITAFOL ULTRA) 29-0.6-0.4-200 MG CAPS Take 1 capsule by mouth daily  before breakfast. 90 capsule 4 04/30/2021   Blood Pressure Monitoring (BLOOD PRESSURE KIT) DEVI Monitor BP readings at home regularly 1 each 0    Misc. Devices (GOJJI WEIGHT SCALE) MISC Monitor weight at home regularly 1 each 0     Review of Systems   Constitutional: Negative for fever and chills Eyes: Negative for visual disturbances Respiratory: Negative for shortness of breath, dyspnea Cardiovascular: Negative for chest pain or palpitations  Gastrointestinal: Negative for vomiting, diarrhea and constipation.  POSITIVE for abdominal pain (contractions) Genitourinary: Negative for dysuria and urgency Musculoskeletal: Negative for back pain, joint pain, myalgias  Neurological: Negative for dizziness and headaches  Blood pressure 132/79, pulse 89, temperature 97.6 F (36.4 C), last menstrual period 07/06/2020, unknown if currently breastfeeding. General appearance: alert, cooperative, and no distress Lungs: normal respiratory effort Heart: regular rate and rhythm Abdomen: soft, non-tender; bowel sounds normal Extremities: Homans sign is negative, no sign of DVT DTR's 2+ Presentation: cephalic Fetal monitoring  Baseline: 140 bpm, Variability: Good {> 6 bpm), Accelerations: Reactive, and Decelerations: Absent Uterine activity  2-4 Dilation: 5.5 Effacement (%): 90 Station: -  1 Exam by:: Dr. Dione Plover   Prenatal labs: ABO, Rh: --/--/PENDING (10/06 1825) Antibody: PENDING (10/06 1825) Rubella: <0.90 (04/28 1136) RPR: Non Reactive (08/08 1041)  HBsAg: Negative (04/28 1136)  HIV: Non Reactive (08/08 1041)  GBS: Negative/-- (09/14 0954)  Nursing Staff Provider  Office Location  [facility]  Dating  U/s   Language  English  Anatomy US  Normal anatomy  Flu Vaccine  2021 unsure month per pt  Genetic/Carrier Screen  NIPS:    AFP:  negative Horizon: negative  TDaP Vaccine   02/23/21 Hgb A1C or  GTT Early  Third trimester   COVID Vaccine Completed    LAB RESULTS   Rhogam   Blood Type  O/Positive/-- (04/28 1136)   Baby Feeding Plan Both  Antibody Negative (04/28 1136)  Contraception No Rubella <0.90 (04/28 1136)  Circumcision Yes if boy  RPR Non Reactive (08/08 1041)   Pediatrician  [facility]  HBsAg Negative (04/28 1136)   Support Person Undecided  HCVAb Negative   Prenatal Classes No  HIV Non Reactive (08/08 1041)     BTL Consent  GBS   neg  VBAC Consent  Pap N/a        BP Cuff Ordered  Waterbirth  _0  Class _1  Consent _2  CNM visit    Induction  _3  Orders Entered _4 Foley Y/N    Prenatal Transfer Tool  Maternal Diabetes: No Genetic Screening: Normal Maternal Ultrasounds/Referrals: Normal Fetal Ultrasounds or other Referrals:  None Maternal Substance Abuse:  No Significant Maternal Medications:  None Significant Maternal Lab Results: Group B Strep negative  Results for orders placed or performed during the hospital encounter of 04/30/21 (from the past 24 hour(s))  Type and screen   Collection Time: 04/30/21  6:25 PM  Result Value Ref Range   ABO/RH(D) PENDING    Antibody Screen PENDING    Sample Expiration      05/03/2021,2359 Performed at [facility], [address], [place], [place] [address]     Assessment: Kathryn Melendez is a 20 y.o. G2P1001 with an IUP at 73w3dpresenting for labor  Plan: #Labor: expectant management #Pain:  Per request #FWB Cat 1    FChristin Fudge10/12/2020, 6:52 PM

## 2021-04-30 NOTE — MAU Note (Signed)
Pt presents to MAU with c/o cxs that started around 2-3 am.  Pt states that cx's are coming every 3-4 minutes.  No LOF.  + FM

## 2021-04-30 NOTE — Anesthesia Preprocedure Evaluation (Addendum)
Anesthesia Evaluation  Patient identified by MRN, date of birth, ID band Patient awake    Reviewed: Allergy & Precautions, NPO status , Patient's Chart, lab work & pertinent test results  Airway Mallampati: II  TM Distance: >3 FB Neck ROM: Full    Dental no notable dental hx. (+) Teeth Intact, Dental Advisory Given   Pulmonary neg pulmonary ROS,    Pulmonary exam normal breath sounds clear to auscultation       Cardiovascular Exercise Tolerance: Good negative cardio ROS Normal cardiovascular exam Rhythm:Regular Rate:Normal     Neuro/Psych negative neurological ROS     GI/Hepatic Neg liver ROS, GERD  ,  Endo/Other  negative endocrine ROS  Renal/GU negative Renal ROS     Musculoskeletal negative musculoskeletal ROS (+)   Abdominal   Peds  Hematology Lab Results      Component                Value               Date                      WBC                      14.6 (H)            04/30/2021                HGB                      15.1 (H)            04/30/2021                HCT                      44.9                04/30/2021                MCV                      90.7                04/30/2021                PLT                      171                 04/30/2021              Anesthesia Other Findings   Reproductive/Obstetrics (+) Pregnancy                            Anesthesia Physical Anesthesia Plan  ASA: 2  Anesthesia Plan: Epidural   Post-op Pain Management:    Induction:   PONV Risk Score and Plan:   Airway Management Planned:   Additional Equipment:   Intra-op Plan:   Post-operative Plan:   Informed Consent:   Plan Discussed with:   Anesthesia Plan Comments: (40.3 wk G2P1 for LEA)        Anesthesia Quick Evaluation

## 2021-04-30 NOTE — Progress Notes (Cosign Needed)
Labor Progress Note Kathryn Melendez is a 20 y.o. G2P1001 at [redacted]w[redacted]d presented for active labor S: Feels nervous but well. Pain well managed with epidural.   O:  BP 112/69   Pulse 83   Temp 98.2 F (36.8 C)   Resp 18   Ht 5\' 6"  (1.676 m)   Wt 80.9 kg   LMP 07/06/2020   SpO2 98%   BMI 28.79 kg/m  EFM: 120/good variability/+accels/no decels  CVE: Dilation: Lip/rim Effacement (%): 100 Station: 0 Presentation: Vertex Exam by:: Dr. Georgia Dom   A&P: 20 y.o. [id number] [redacted]w[redacted]d here for active labor #Labor: Progressing well. AROM performed with clear fluid noted. Patient and baby tolerated procedure well. #Pain: well controlled. Has epidural.  #FWB: cat 1 #GBS negative  Mayer Masker, PGY-1, Faculty Service 9:17 PM

## 2021-04-30 NOTE — Discharge Summary (Addendum)
Postpartum Discharge Summary  Date of Service updated     Patient Name: Kathryn Melendez DOB: 08-27-2000 MRN: [id number]  Date of admission: 04/30/2021 Delivery date:04/30/2021  Delivering provider: Christin Fudge  Date of discharge: 05/02/2021  Admitting diagnosis: Post term pregnancy at [redacted] weeks gestation [O48.0, Z3A.41] Intrauterine pregnancy: [redacted]w[redacted]d     Secondary diagnosis:  Active Problems:   Post term pregnancy at [redacted] weeks gestation   Rubella non-immune status, delivered, current hospitalization  Additional problems: None   Discharge diagnosis: Term Pregnancy Delivered                                              Post partum procedures: Liletta placed Augmentation: AROM Complications: None  Hospital course: Onset of Labor With Vaginal Delivery      20 y.o. yo G2P1001 at [redacted]w[redacted]d was admitted in Active Labor on 04/30/2021. Patient had an uncomplicated labor course as follows:  Membrane Rupture Time/Date: 9:05 PM ,04/30/2021   Delivery Method:Vaginal, Spontaneous  Episiotomy: None  Lacerations:  None  Patient had an uncomplicated postpartum course.  She is ambulating, tolerating a regular diet, passing flatus, and urinating well. Patient is discharged home in stable condition on 05/02/21.  Newborn Data: Birth date:04/30/2021  Birth time:11:16 PM  Gender:Female  Living status:Living  Apgars:9 ,9  Weight:3226 g   Magnesium Sulfate received: No BMZ received: No Rhophylac:No MMR:N/A T-DaP:Given prenatally Flu: N/A Transfusion:No  Physical exam  Vitals:   05/01/21 0636 05/01/21 0942 05/01/21 1402 05/01/21 1959  BP: 104/62 110/70 123/68 119/67  Pulse: (!) 53 77 (!) 58 (!) 51  Resp: $Remo'14 20 16 19  'lrLbp$ Temp: 97.7 F (36.5 C) 98 F (36.7 C) 98.1 F (36.7 C) 98.1 F (36.7 C)  TempSrc: Oral Oral Oral Oral  SpO2: 97% 98% 99% 98%  Weight:      Height:       General: alert, cooperative, and no distress Lochia: appropriate Uterine Fundus: firm Incision: N/A DVT  Evaluation: No evidence of DVT seen on physical exam. Labs: Lab Results  Component Value Date   WBC 14.6 (H) 04/30/2021   HGB 15.1 (H) 04/30/2021   HCT 44.9 04/30/2021   MCV 90.7 04/30/2021   PLT 171 04/30/2021   No flowsheet data found. Edinburgh Score: Edinburgh Postnatal Depression Scale Screening Tool 05/01/2021  I have been able to laugh and see the funny side of things. 0  I have looked forward with enjoyment to things. 0  I have blamed myself unnecessarily when things went wrong. 0  I have been anxious or worried for no good reason. 0  I have felt scared or panicky for no good reason. 0  Things have been getting on top of me. 0  I have been so unhappy that I have had difficulty sleeping. 0  I have felt sad or miserable. 0  I have been so unhappy that I have been crying. 0  The thought of harming myself has occurred to me. 0  Edinburgh Postnatal Depression Scale Total 0     After visit meds:  Allergies as of 05/02/2021       Reactions   Penicillins    rash        Medication List     TAKE these medications    Blood Pressure Kit Devi Monitor BP readings at home regularly   Warren Weight Scale Misc  Monitor weight at home regularly   Vitafol Ultra 29-0.6-0.4-200 MG Caps Take 1 capsule by mouth daily before breakfast.         Discharge home in stable condition Infant Feeding: Breast Infant Disposition:home with mother Discharge instruction: per After Visit Summary and Postpartum booklet. Activity: Advance as tolerated. Pelvic rest for 6 weeks.  Diet: routine diet Future Appointments: Future Appointments  Date Time Provider [place]  06/04/2021 10:55 AM Gavin Pound, CNM [place] None   Follow up Visit:   Please schedule this patient for a In person postpartum visit in 4 weeks with the following provider: Any provider. Additional Postpartum F/U: IUD string check Low risk pregnancy complicated by:  Delivery mode:  Vaginal, Spontaneous   Anticipated Birth Control:  PP IUD placed   05/02/2021 Starr Lake, CNM

## 2021-04-30 NOTE — MAU Provider Note (Signed)
Kathryn Melendez is a 20 y.o. G33P1001 female at [redacted]w[redacted]d.  RN Labor check, not seen by provider.  SVE by RN: Dilation: 2 Effacement (%): 70 Station: -3 Exam by:: J.Bellamy, RN  NST: FHR baseline 115 bpm, Variability: moderate, Accelerations:present, Decelerations:  Absent= Cat 1/Reactive Toco: Every 4-6 minutes Unchanged on repeat SVE; false labor   D/C home  Genia Del, MD  04/30/2021 10:46 AM

## 2021-05-01 DIAGNOSIS — O480 Post-term pregnancy: Secondary | ICD-10-CM | POA: Diagnosis not present

## 2021-05-01 DIAGNOSIS — Z3A40 40 weeks gestation of pregnancy: Secondary | ICD-10-CM | POA: Diagnosis not present

## 2021-05-01 LAB — RPR: RPR Ser Ql: NONREACTIVE

## 2021-05-01 MED ORDER — DIBUCAINE (PERIANAL) 1 % EX OINT: 1.0000 "application " | TOPICAL_OINTMENT | CUTANEOUS | Status: DC | PRN

## 2021-05-01 MED ORDER — ONDANSETRON HCL 4 MG/2ML IJ SOLN: 4.0000 mg | INTRAMUSCULAR | Status: DC | PRN

## 2021-05-01 MED ORDER — ACETAMINOPHEN 325 MG PO TABS: 650.0000 mg | ORAL_TABLET | ORAL | Status: DC | PRN

## 2021-05-01 MED ORDER — WITCH HAZEL-GLYCERIN EX PADS: 1.0000 "application " | MEDICATED_PAD | CUTANEOUS | Status: DC | PRN

## 2021-05-01 MED ORDER — IBUPROFEN 600 MG PO TABS
600.0000 mg | ORAL_TABLET | Freq: Four times a day (QID) | ORAL | Status: DC
  Administered 2021-05-01 – 2021-05-02 (×5): 600 mg via ORAL
  Filled 2021-05-01 (×6): qty 1

## 2021-05-01 MED ORDER — SIMETHICONE 80 MG PO CHEW
80.0000 mg | CHEWABLE_TABLET | ORAL | Status: DC | PRN
  Filled 2021-05-01: qty 1

## 2021-05-01 MED ORDER — ONDANSETRON HCL 4 MG PO TABS: 4.0000 mg | ORAL_TABLET | ORAL | Status: DC | PRN

## 2021-05-01 MED ORDER — BENZOCAINE-MENTHOL 20-0.5 % EX AERO
1.0000 "application " | INHALATION_SPRAY | CUTANEOUS | Status: DC | PRN
  Filled 2021-05-01: qty 56

## 2021-05-01 MED ORDER — INFLUENZA VAC SPLIT QUAD 0.5 ML IM SUSY
0.5000 mL | PREFILLED_SYRINGE | INTRAMUSCULAR | Status: AC
  Administered 2021-05-02: 0.5 mL via INTRAMUSCULAR
  Filled 2021-05-01: qty 0.5

## 2021-05-01 MED ORDER — PRENATAL MULTIVITAMIN CH
1.0000 | ORAL_TABLET | Freq: Every day | ORAL | Status: DC
  Administered 2021-05-01 – 2021-05-02 (×2): 1 via ORAL
  Filled 2021-05-01 (×2): qty 1

## 2021-05-01 MED ORDER — TETANUS-DIPHTH-ACELL PERTUSSIS 5-2.5-18.5 LF-MCG/0.5 IM SUSY: 0.5000 mL | PREFILLED_SYRINGE | Freq: Once | INTRAMUSCULAR | Status: DC

## 2021-05-01 MED ORDER — SENNOSIDES-DOCUSATE SODIUM 8.6-50 MG PO TABS
2.0000 | ORAL_TABLET | Freq: Every day | ORAL | Status: DC
  Administered 2021-05-01: 2 via ORAL
  Filled 2021-05-01: qty 2

## 2021-05-01 MED ORDER — DIPHENHYDRAMINE HCL 25 MG PO CAPS: 25.0000 mg | ORAL_CAPSULE | Freq: Four times a day (QID) | ORAL | Status: DC | PRN

## 2021-05-01 MED ORDER — COCONUT OIL OIL: 1.0000 "application " | TOPICAL_OIL | Status: DC | PRN

## 2021-05-01 NOTE — Progress Notes (Signed)
Post Partum Day 1 Subjective: no complaints, up ad lib, voiding and tolerating PO, small lochia, plans to breastfeed,  got PP IUD  Objective: Blood pressure 104/62, pulse (!) 53, temperature 97.7 F (36.5 C), temperature source Oral, resp. rate 14, height 5\' 6"  (1.676 m), weight 80.9 kg, last menstrual period 07/06/2020, SpO2 97 %, unknown if currently breastfeeding.  Physical Exam:  General: alert, cooperative and no distress Lochia:normal flow Chest: CTAB Heart: RRR no m/r/g Abdomen: +BS, soft, nontender,  Uterine Fundus: firm DVT Evaluation: No evidence of DVT seen on physical exam. Extremities: no edema  Recent Labs    04/30/21 1830  HGB 15.1*  HCT 44.9    Assessment/Plan: Plan for discharge tomorrow   LOS: 1 day   Kathryn Melendez 05/01/2021, 8:19 AM

## 2021-05-01 NOTE — Anesthesia Postprocedure Evaluation (Signed)
Anesthesia Post Note  Patient: Kathryn Melendez  Procedure(s) Performed: AN AD [place]     Patient location during evaluation: Mother Baby Anesthesia Type: Epidural Level of consciousness: awake and alert Pain management: pain level controlled Vital Signs Assessment: post-procedure vital signs reviewed and stable Respiratory status: spontaneous breathing, nonlabored ventilation and respiratory function stable Cardiovascular status: stable Postop Assessment: no headache, no backache, epidural receding, no apparent nausea or vomiting, patient able to bend at knees, adequate PO intake and able to ambulate Anesthetic complications: no   No notable events documented.  Last Vitals:  Vitals:   05/01/21 0234 05/01/21 0636  BP: 107/66 104/62  Pulse: 60 (!) 53  Resp: 15 14  Temp: 36.5 C 36.5 C  SpO2: 98% 97%    Last Pain:  Vitals:   05/01/21 0636  TempSrc: Oral  PainSc: 0-No pain   Pain Goal:                   [employer]

## 2021-05-01 NOTE — Lactation Note (Signed)
This note was copied from a baby's chart. Lactation Consultation Note  Patient Name: Kathryn Melendez QXIHW'T Date: 05/01/2021 Reason for consult: L&D Initial assessment;Term Age:21 hours LC entered the room, mom was doing skin to skin and then infant was swaddled and handed to family member. Mom recently finished breastfeeding infant for 15 minutes prior to [facility] entering the room. Mom know how to hand express. Mom knows to call [place] on MBU if she needs any assistance with latching infant at the breast.  Mom knows to breastfeed infant according to feeding cues, 8 to 12+ or more times within 24 hours, skin to skin. Maternal Data Does the patient have breastfeeding experience prior to this delivery?: Yes How long did the patient breastfeed?: Per mom, she BF her 1st child for 3 months but stopped due to low milk supply, she did milk sharing with family member.  Feeding Mother's Current Feeding Choice: Breast Milk  LATCH Score Latch: Grasps breast easily, tongue down, lips flanged, rhythmical sucking.  Audible Swallowing: Spontaneous and intermittent  Type of Nipple: Everted at rest and after stimulation  Comfort (Breast/Nipple): Soft / non-tender  Hold (Positioning): No assistance needed to correctly position infant at breast.  LATCH Score: 10   Lactation Tools Discussed/Used    Interventions Interventions: Skin to skin;Position options;Education  Discharge    Consult Status Consult Status: Follow-up from L&D    Vicente Serene 05/01/2021, 1:28 AM

## 2021-05-01 NOTE — Progress Notes (Signed)
CSW received consult for hx of Anxiety.  CSW met with MOB to offer support and complete assessment.    CSW introduced self and explained role. MOB mother in law was present in room. MOB was pleasant and engaged in conversation. CSW inquired about MOBs mental health history. MOB stated she stated in a prenatal appointment that she has anxiety. MOB expressed that she does not have a diagnosis and does not receive any medications for anxiety nor does she feel like she needs it. MOB reports she has a great support system consisting of her husband, her parents and her in laws. CSW and MOB reviewed PPD and SIDS, MOB expressed an understanding. MOB stated she had all items for the baby including crib and carseat. MOB did not express any needs.   CSW provided education regarding the baby blues period vs. perinatal mood disorders, discussed treatment and gave resources for mental health follow up if concerns arise.  CSW recommends self-evaluation during the postpartum time period using the New Mom Checklist from Postpartum Progress and encouraged MOB to contact a medical professional if symptoms are noted at any time.   CSW provided review of Sudden Infant Death Syndrome (SIDS) precautions.   CSW identifies no further need for intervention and no barriers to discharge at this time.  Emeterio Reeve, LCSW Clinical Social Worker

## 2021-05-01 NOTE — Lactation Note (Signed)
This note was copied from a baby's chart. Lactation Consultation Note  Patient Name: Kathryn Melendez Date: 05/01/2021 Reason for consult: Follow-up assessment;Term;Infant weight loss;Other (Comment) (Baby asleep / per mom baby fed 20 mins at 49, LC reviewed doc flow sheets with mom / WNL for age. per mom breast feeding is going well since delivery and denies sore nipples.) Age:21 hours  Maternal Data    Feeding Mother's Current Feeding Choice: Breast Milk  LATCH Score                    Lactation Tools Discussed/Used Tools: Pump;Other (comment) (mom already has the hand pump) Breast pump type: Manual  Interventions Interventions: Breast feeding basics reviewed;Education  Discharge    Consult Status Consult Status: Follow-up Date: 05/02/21 Follow-up type: In-patient    [place] 05/01/2021, 2:54 PM

## 2021-05-02 DIAGNOSIS — O480 Post-term pregnancy: Secondary | ICD-10-CM | POA: Diagnosis not present

## 2021-05-02 DIAGNOSIS — Z3A40 40 weeks gestation of pregnancy: Secondary | ICD-10-CM | POA: Diagnosis not present

## 2021-05-02 DIAGNOSIS — Z2839 Other underimmunization status: Secondary | ICD-10-CM

## 2021-05-02 DIAGNOSIS — O99892 Other specified diseases and conditions complicating childbirth: Secondary | ICD-10-CM

## 2021-05-02 MED ORDER — MEASLES, MUMPS & RUBELLA VAC IJ SOLR
0.5000 mL | Freq: Once | INTRAMUSCULAR | Status: AC
  Administered 2021-05-02: 0.5 mL via SUBCUTANEOUS
  Filled 2021-05-02: qty 0.5

## 2021-05-02 NOTE — Progress Notes (Signed)
CSW met with MOB to complete consult for MOB's request to see CSW again. CSW observed MOB resting in bed, infant in bassinet, and mother in law in recliner. CSW asked mother in law, to exit room for MOB's privacy. Mother in law, remove herself from room with no incident to report. CSW explained role, and reason for consult. MOB was pleasant, and polite during engagement with CSW. MOB reported, since her admission, she has difficulties finding conformability. MOB reported, she has not been able to sleep, because of traffic in room. MOB reported, she has been told different things regarding breast feeding, and it has been very overwhelming. MOB reported, medical team was not aware that, if she could not produce enough milk. MOB reported, that infant would then receive donor milk from mother in law. MOB reported, she wants to continue care for her, and infant. However, she is ready to go home, and relax.   MOB reported, her father, and mother in law are very supportive. MOB reported, she lives between her father, and mother in law homes. MOB reported, majority of her support comes from her mother in law. CSW ask if FOB would be involve in infant's care. MOB reported, FOB is the sperm donor, and is not really involve with neither children. MOB reported, she, and FOB are not in a relationship, but are still sexually active. MOB reported, FOB does not live with his mother, and denied any domestic violence involvement. MOB reported, despite FOB's involvement, he did sign infant's birth certificate. MOB reported, MOB denied SI, HI, and could consent to safety when CSW assessed for safety.   MOB reported, she has all essentials needed to care for infant. MOB request if mother in law could come back in to identify additional barriers. MOB gave CSW verbal consent to continue consult while mother in law was present. Mother in law did not identify any additional barriers. CSW apologize to MOB, and mother in law for their  experience during admission. CSW provided MOB with patient experience number to provide feedback on her admission. Per mother in law, she works at [employer], and is familiar with the process.   Per chart 10/7, "CSW provided education regarding the baby blues period vs. perinatal mood disorders, discussed treatment and gave resources for mental health follow up if concerns arise.  CSW recommends self-evaluation during the postpartum time period using the New Mom Checklist from Postpartum Progress and encouraged MOB to contact a medical professional if symptoms are noted at any time.   CSW provided review of Sudden Infant Death Syndrome (SIDS) precautions".     CSW identifies no further need for intervention or barriers to discharge at this time.  Kathryn Melendez, MSW, LCSW-A Clinical Social Worker- Weekends [phone]   

## 2021-05-02 NOTE — Lactation Note (Signed)
This note was copied from a baby's chart. Lactation Consultation Note  Patient Name: Kathryn Melendez [id number]'[id number] Date: 05/02/2021 Reason for consult: Follow-up assessment Age:21 hours  Mother hand expressed drops and latched baby in football hold.  Intermittent swallows observed. Provided comfort gels for abrasion on tip of L nipple and #24NS per request for soreness to use as mother desires. Mother asked what she could do to increase her supply. Suggest feeding on demand and post pumping a few times a day to increase volume. Stools transitioning. Reviewed engorgement care and monitoring voids/stools.   Feeding Mother's Current Feeding Choice: Breast Milk and Donor Milk (shared milk)  LATCH Score Latch: Grasps breast easily, tongue down, lips flanged, rhythmical sucking.  Audible Swallowing: Spontaneous and intermittent  Type of Nipple: Everted at rest and after stimulation  Comfort (Breast/Nipple): Filling, red/small blisters or bruises, mild/mod discomfort  Hold (Positioning): Assistance needed to correctly position infant at breast and maintain latch.  LATCH Score: 8   Interventions Interventions: Breast feeding basics reviewed;Assisted with latch;Hand express;Hand pump;Education  Discharge Pump: Personal;DEBP  Consult Status Consult Status: Complete Date: 05/02/21 Follow-up type: In-patient    Vivianne Master [facility] 05/02/2021, 10:46 AM

## 2021-05-02 NOTE — Lactation Note (Signed)
This note was copied from a baby's chart. Lactation Consultation Note  Patient Name: Kathryn Melendez [id number]'[id number] Date: 05/02/2021 Reason for consult: Follow-up assessment Age:21 hours  Mother resting.  Spoke with mother in law who is donating her milk to baby and consent has been signed.  Per Mother in law, mother's nipples are sore and baby cluster fed last night.  Suggest family call for next feeding and to evaluate mother's nipples.  Feeding Mother's Current Feeding Choice: Breast Milk and Donor Milk (shared milk/consent signed)  Interventions Interventions: Education   Consult Status Consult Status: Follow-up Date: 05/02/21 Follow-up type: In-patient    Vivianne Master [facility] 05/02/2021, 9:38 AM

## 2021-05-04 ENCOUNTER — Inpatient Hospital Stay (HOSPITAL_COMMUNITY): Payer: Medicaid Other

## 2021-05-04 ENCOUNTER — Inpatient Hospital Stay (HOSPITAL_COMMUNITY): Admission: AD | Admit: 2021-05-04 | Payer: Medicaid Other | Source: Home / Self Care | Admitting: Family Medicine

## 2021-05-11 ENCOUNTER — Telehealth (HOSPITAL_COMMUNITY): Payer: Self-pay

## 2021-05-11 NOTE — Telephone Encounter (Signed)
No answer. Left message to return nurse call.  Sharyn Lull [facility] 05/11/2021,1709

## 2021-06-04 ENCOUNTER — Ambulatory Visit: Payer: Medicaid Other

## 2021-06-11 ENCOUNTER — Ambulatory Visit (INDEPENDENT_AMBULATORY_CARE_PROVIDER_SITE_OTHER): Payer: Medicaid Other

## 2021-06-11 ENCOUNTER — Other Ambulatory Visit: Payer: Self-pay

## 2021-06-11 ENCOUNTER — Other Ambulatory Visit (HOSPITAL_COMMUNITY)
Admission: RE | Admit: 2021-06-11 | Discharge: 2021-06-11 | Disposition: A | Discharge status: Home / Self Care | Payer: Medicaid Other | Source: Ambulatory Visit

## 2021-06-11 DIAGNOSIS — Z124 Encounter for screening for malignant neoplasm of cervix: Secondary | ICD-10-CM | POA: Insufficient documentation

## 2021-06-11 NOTE — Progress Notes (Signed)
[place] Partum Visit Note  Kathryn Melendez is a 20 y.o. G23P1001 female who presents for a postpartum visit. She is 6 weeks postpartum following a normal spontaneous vaginal delivery.  I have fully reviewed the prenatal and intrapartum course. The delivery was at [redacted]w[redacted]d gestational weeks.  Anesthesia: epidural. Postpartum course has been unremarkable. Baby is doing well. Baby is feeding by bottle - Gerber Gentle . Bleeding thin lochia. Bowel function is normal. Bladder function is normal. Patient is sexually active. Contraception method is IUD. Postpartum depression screening: negative. EPDS: 0  She reports she just switched baby to formula.  She reports some breast soreness and cracked nipples. She reports she receives support from her parents and "baby daddy parents." She reports at least 4 hours of sleep daily.  She denies feelings of depression.   The pregnancy intention screening data noted above was reviewed. Potential methods of contraception were discussed. The patient had Liletta IUD placed at delivery.     The following portions of the patient's history were reviewed and updated as appropriate: allergies, current medications, past family history, past medical history, past social history, past surgical history, and problem list.  Review of Systems Pertinent items noted in HPI and remainder of comprehensive ROS otherwise negative.    Objective:  BP 104/62   Pulse 68   Ht 5\' 6"  (1.676 m)   Wt 159 lb (72.1 kg)   LMP 07/06/2020   Breastfeeding No   BMI 25.66 kg/m    General:  alert, cooperative, and no distress   Breasts:  inspection negative, no nipple discharge or bleeding, no masses or nodularity palpable  Lungs: clear to auscultation bilaterally  Heart:  regular rate and rhythm  Abdomen: soft, non-tender; bowel sounds normal; no masses,  no organomegaly   Vulva:  normal  Vagina: normal vagina, no discharge, exudate, lesion, or erythema  Cervix:  multiparous appearance, no  cervical motion tenderness, no lesions, and Blue IUD noted from os . Trimmed to ~3cm. Scant  mucoid blood.  Pap collected with brush and spatula  Corpus: normal  Adnexa:  not evaluated  Rectal Exam: Not performed.        Assessment:   6 week postpartum exam S/P SVD Normal Involution IUD in Place Pap smear done at today's visit.   Plan:   -Okay to return to normal activities as tolerated. -Reviewed weaning techniques. -Informed of pap status and due since 20st birthday just occurred. -Reviewed ASCCP Guidelines and reporting methods. -Pap completed. -IUD strings trimmed.  -Will manage as appropriate. -RTO in one year or prn. Essential components of care per ACOG recommendations:  1.  Mood and well being: Patient with negative depression screening today. Reviewed local resources for support.  - Patient does not use tobacco. If using tobacco we discussed reduction and for recently cessation risk of relapse - hx of drug use? No    2. Infant care and feeding:  -Patient currently breastmilk feeding? No Discussed methods for weaning including cold compresses, tylenol, and s/s of mastitis. -Social determinants of health (SDOH) reviewed in EPIC. No concerns  3. Sexuality, contraception and birth spacing - Patient does not want a pregnancy in the next year.  Desired family size is unsure children.  - Reviewed forms of contraception in tiered fashion. Patient desired IUD today.   - Discussed birth spacing of 18 months  4. Sleep and fatigue -Encouraged family/partner/community support of 4 hrs of uninterrupted sleep to help with mood and fatigue  5. Physical Recovery  -  Discussed patients delivery and complications. Reports delivery was "amazing." - Patient had a no laceration, perineal healing reviewed. Patient expressed understanding - Patient has urinary incontinence? No - Reports urinating is "weird" but unable to elaborate.  - Patient is safe to resume physical and sexual  activity  6.  Health Maintenance - Last pap smear done today and is pending No Mammogram  7. No Chronic Disease - PCP follow up  Maryann Conners, [place] [facility] [place]

## 2021-06-17 LAB — CYTOLOGY - PAP
Diagnosis: NEGATIVE
Diagnosis: REACTIVE

## 2022-04-20 NOTE — Progress Notes (Deleted)
  Subjective:    Kathryn Melendez - 21 y.o. female MRN [id number]  Date of birth: 10/17/2000  HPI  Kathryn Melendez is to establish care.  Current issues and/or concerns:  ROS per HPI     Health Maintenance:  Health Maintenance Due  Topic Date Due   HPV VACCINES (1 - 2-dose series) Never done   INFLUENZA VACCINE  02/23/2022   CHLAMYDIA SCREENING  04/08/2022     Past Medical History: Patient Active Problem List   Diagnosis Date Noted   Rubella non-immune status, delivered, current hospitalization 05/02/2021   Postpartum care following vaginal delivery 12/10/2019   Greater trochanteric bursitis of right hip 06/01/2013   GERD 08/12/2008      Social History   reports that she has never smoked. She has never used smokeless tobacco. She reports that she does not drink alcohol and does not use drugs.   Family History  family history includes Cancer in her maternal grandmother and paternal grandmother.   Medications: reviewed and updated   Objective:   Physical Exam There were no vitals taken for this visit. Physical Exam      Assessment & Plan:         Patient was given clear instructions to go to Emergency Department or return to medical center if symptoms don't improve, worsen, or new problems develop.The patient verbalized understanding.  I discussed the assessment and treatment plan with the patient. The patient was provided an opportunity to ask questions and all were answered. The patient agreed with the plan and demonstrated an understanding of the instructions.   The patient was advised to call back or seek an in-person evaluation if the symptoms worsen or if the condition fails to improve as anticipated.    Durene Fruits, NP 04/20/2022, 9:54 PM Primary Care at [facility]

## 2022-04-27 ENCOUNTER — Ambulatory Visit: Payer: Medicaid Other | Admitting: Family

## 2022-04-27 DIAGNOSIS — Z7689 Persons encountering health services in other specified circumstances: Secondary | ICD-10-CM

## 2022-07-22 NOTE — Progress Notes (Signed)
Erroneous encounter-disregard

## 2022-07-28 ENCOUNTER — Encounter: Payer: Medicaid Other | Admitting: Family

## 2022-07-28 DIAGNOSIS — Z7689 Persons encountering health services in other specified circumstances: Secondary | ICD-10-CM
